# Patient Record
Sex: Male | Born: 1935 | Race: White | Hispanic: No | Marital: Married | Smoking: Never smoker
Health system: Southern US, Community
[De-identification: ages and names within clinical notes are randomized; demographics above are authoritative.]

## PROBLEM LIST (undated history)

## (undated) DIAGNOSIS — N200 Calculus of kidney: Secondary | ICD-10-CM

## (undated) DIAGNOSIS — G20A1 Parkinson's disease without dyskinesia, without mention of fluctuations: Secondary | ICD-10-CM

## (undated) DIAGNOSIS — G20 Parkinson's disease: Secondary | ICD-10-CM

## (undated) DIAGNOSIS — I959 Hypotension, unspecified: Secondary | ICD-10-CM

## (undated) DIAGNOSIS — R413 Other amnesia: Secondary | ICD-10-CM

## (undated) HISTORY — DX: Other amnesia: R41.3

## (undated) HISTORY — DX: Parkinson's disease: G20

## (undated) HISTORY — DX: Parkinson's disease without dyskinesia, without mention of fluctuations: G20.A1

## (undated) HISTORY — PX: OTHER SURGICAL HISTORY: SHX169

## (undated) HISTORY — DX: Calculus of kidney: N20.0

---

## 1991-06-28 ENCOUNTER — Encounter: Payer: Self-pay | Admitting: Cardiology

## 2012-05-23 ENCOUNTER — Other Ambulatory Visit: Payer: Self-pay | Admitting: Neurology

## 2012-05-23 DIAGNOSIS — G20A1 Parkinson's disease without dyskinesia, without mention of fluctuations: Secondary | ICD-10-CM

## 2012-05-23 DIAGNOSIS — G20 Parkinson's disease: Secondary | ICD-10-CM

## 2012-05-23 DIAGNOSIS — W19XXXA Unspecified fall, initial encounter: Secondary | ICD-10-CM

## 2012-05-30 ENCOUNTER — Ambulatory Visit
Admission: RE | Admit: 2012-05-30 | Discharge: 2012-05-30 | Disposition: A | Discharge status: Home / Self Care | Payer: Medicare Other | Source: Ambulatory Visit | Attending: Neurology | Admitting: Neurology

## 2012-05-30 DIAGNOSIS — W19XXXA Unspecified fall, initial encounter: Secondary | ICD-10-CM

## 2012-05-30 DIAGNOSIS — G20 Parkinson's disease: Secondary | ICD-10-CM

## 2013-02-19 ENCOUNTER — Ambulatory Visit: Payer: Self-pay | Admitting: Neurology

## 2013-03-13 ENCOUNTER — Encounter: Payer: Self-pay | Admitting: Neurology

## 2013-03-14 ENCOUNTER — Encounter: Payer: Self-pay | Admitting: Neurology

## 2013-03-14 ENCOUNTER — Ambulatory Visit (INDEPENDENT_AMBULATORY_CARE_PROVIDER_SITE_OTHER): Payer: Medicare Other | Admitting: Neurology

## 2013-03-14 VITALS — BP 146/68 | HR 54 | Resp 16 | Ht 67.0 in | Wt 149.0 lb

## 2013-03-14 DIAGNOSIS — G20A1 Parkinson's disease without dyskinesia, without mention of fluctuations: Secondary | ICD-10-CM | POA: Insufficient documentation

## 2013-03-14 DIAGNOSIS — G20 Parkinson's disease: Secondary | ICD-10-CM | POA: Insufficient documentation

## 2013-03-14 MED ORDER — PRAMIPEXOLE DIHYDROCHLORIDE 0.125 MG PO TABS: 0.1250 mg | ORAL_TABLET | Freq: Three times a day (TID) | ORAL | Status: DC

## 2013-03-14 NOTE — Patient Instructions (Signed)
Parkinson's Disease Parkinson's disease is a disorder of the brain and spinal cord (central nervous system). The person will slowly lose the ability to control his or her body movements. This happens due to:  Damaged nerve cells.  Low levels of a certain brain chemical. HOME CARE  Exercise often.  Make time to rest during the day.  Take all medicine as told by your doctor.  Replace buttons and zippers with elastic and Velcro if getting dressed is difficult.  Put grab bars or rails in your home. This helps you to not fall.  Go to speech therapy or therapy to help you with daily activities (occupational therapy). Do this as told by your doctor.  Keep all doctor visits as told. GET HELP IF:  Your medicine does not help your symptoms.  You fall.  You have trouble swallowing or choke on your food. MAKE SURE YOU:  Understand these instructions.  Will watch your condition.  Will get help right away if you are not doing well or get worse. Document Released: 09/05/2011 Document Reviewed: 09/05/2011 [facility] Patient Information 2014 [place], [place]. Fall Prevention, Elderly Falls are the leading cause of injuries, accidents, and accidental deaths in people over the age of 28. Falling is a real threat to your ability to live on your own. CAUSES   Poor eyesight or poor hearing can make you more likely to fall.   Illnesses and physical conditions can affect your strength and balance.   Poor lighting, throw rugs and pets in your home can make you more likely to trip or slip.   The side effects of some medicines can upset your balance and lead to falling. These include medicines for depression, sleep problems, high blood pressure, diabetes, and heart conditions.  PREVENTION  Be sure your home is as safe as possible. Here are some tips:  Wear shoes with non-skid soles (not house slippers).   Be sure your home and outside area are well lit.   Use night lights throughout your  house, including hallways and stairways.   Remove clutter and clean up spills on floors and walkways.   Remove throw rugs or fasten them to the floor with carpet tape. Tack down carpet edges.   Do not place electrical cords across pathways.   Install grab bars in your bathtub, shower, and toilet area. Towel bars should not be used as a grab bar.   Install handrails on both sides of stairways.   Do not climb on stools or stepladders. Get someone else to help with jobs that require climbing.   Do not wax your floors at all, or use a non-skid wax.   Repair uneven or unsafe sidewalks, walkways or stairs.   Keep frequently used items within reach.   Be aware of pets so you do not trip.  Get regular check-ups from your doctor, and take good care of yourself:  Have your eyes checked every year for vision changes, cataracts, glaucoma, and other eye problems. Wear eyeglasses as directed.   Have your hearing checked every 2 years, or anytime you or others think that you cannot hear well. Use hearing aids as directed.   See your caregiver if you have foot pain or corns. Sore feet can contribute to falls.   Let your caregiver know if a medicine is making you feel dizzy or making you lose your balance.   Use a cane, walker, or wheelchair as directed. Use walker or wheelchair brakes when getting in and out.  When you get up from bed, sit on the side of the bed for 1 to 2 minutes before you stand up. This will give your blood pressure time to adjust, and you will feel less dizzy.   If you need to go to the bathroom often, consider using a bedside commode.  Keep your body in good shape:  Get regular exercise, especially walking.   Do exercises to strengthen the muscles you use for walking and lifting.   Do not smoke.   Minimize use of alcohol.  SEEK IMMEDIATE MEDICAL CARE IF:   You feel dizzy, weak, or unsteady on your feet.   You feel confused.   You fall.  Document Released:  06/13/2005 Document Revised: 06/02/2011 Document Reviewed: 12/08/2006 [facility] Patient Information 2012 [place], [place].

## 2013-03-14 NOTE — Progress Notes (Signed)
[facility]  Provider:  Melvyn Novas, M D  Referring Provider: No ref. provider found Primary Care Physician:  Miguel Aschoff, MD  Chief Complaint  Patient presents with  . Follow-up    tremors,rm 11    HPI:  Austin Juarez is a 79 y.o. male  Is seen here as a referral/ revisit  from Dr.Ross , for Tremor and  muscle rigor, thought to reflect Parkinson's  disease.   The patient has been last seen in this office by NP. The patient continues to have a mild jaw tremor, a right-hand dominant tremor or is not seen today at rest. He does have several dermatitis and a masked face. He continues to play golf and is encouraged to continue physical activity. He is taking care of his granddaughter, who lives with them. During my last visit with the patient on 03-29-2012 the patient did better on Mirapex but due to the expense correlated with his medication stayed on twice a day dosing and did not use it 3 times a day. We waited for January of this year when his prescription but unchanged. Laps and EEGs in the last visit were normal an imaging study was scheduled. He remains independent in all activities of daily living. He had noticed some bruising in the past from daily baby aspirin him and has for that reason stopped the aspirin.  However, I would like for him to try to take the aspirin every other day to have some of the vascular protective benefits of the medication.      Review of Systems: Out of a complete 14 system review, the patient complains of only the following symptoms, and all other reviewed systems are negative. Tremor improved,  Drooling worsened,  Sleeps sound, but has nocturia 2 times nightly, he drools at night , too. Dysphonia. No dysphagia, no falls, he restricted driving to residential only.    History   Social History  . Marital Status: Married    Spouse Name: [name]    Number of Children: 2  . Years of Education: 16   Occupational History  . retired     Social History Main Topics  . Smoking status: Never Smoker   . Smokeless tobacco: Never Used  . Alcohol Use: No  . Drug Use: No  . Sexual Activity: Not on file   Other Topics Concern  . Not on file   Social History Narrative  . No narrative on file    Family History  Problem Relation Age of Onset  . Alcoholism Daughter     Past Medical History  Diagnosis Date  . Kidney stones   . Hypertension   . PD (Parkinson's disease)     History reviewed. No pertinent past surgical history.  Current Outpatient Prescriptions  Medication Sig Dispense Refill  . aspirin 81 MG tablet Take 81 mg by mouth daily.      . Chlorpheniramine Maleate (ALLERGY RELIEF PO) Take 25 mg by mouth daily.      . pramipexole (MIRAPEX) 0.125 MG tablet Take 0.125 mg by mouth 2 (two) times daily.       No current facility-administered medications for this visit.    Allergies as of 03/14/2013 - Review Complete 03/14/2013  Allergen Reaction Noted  . Flomax [tamsulosin hcl] Nausea Only 03/13/2013  . Morphine and related  03/13/2013    Vitals: BP 146/68  Pulse 54  Resp 16  Ht 5\' 7"  (1.702 m)  Wt 149 lb (67.586 kg)  BMI 23.33  kg/m2 Last Weight:  Wt Readings from Last 1 Encounters:  03/14/13 149 lb (67.586 kg)   Last Height:   Ht Readings from Last 1 Encounters:  03/14/13 5\' 7"  (1.702 m)    Physical exam:  General: The patient is awake, alert and appears not in acute distress. The patient is well groomed. He gained 11 pounds since  have seen him last.  Head: Normocephalic, atraumatic. Neck is supple. Cardiovascular:  Regular rate and rhythm, without  murmurs or carotid bruit, and without distended neck veins. Respiratory: Lungs are clear to auscultation. Skin:  Without evidence of edema, or rash Trunk: stooped posture.   Neurologic exam : The patient is awake and alert, oriented to place and time.  Memory subjective described as intact. There is a normal attention span & concentration  ability. Speech is fluent without  Dysarthria, but with  dysphonia  Mood and affect are appropriate.  Cranial nerves: Pupils are equal and briskly reactive to light. Funduscopic exam normal , . Extraocular movements  in vertical and horizontal planes intact and without nystagmus.  Visual fields by finger perimetry are intact. Hearing to finger rub intact.  Facial sensation intact to fine touch. Facial motor strength is symmetric and tongue and uvula move midline.  Motor exam:  Normal muscle bulk and symmetric normal strength in all extremities. Tone is increased, with traces of cog wheeling at wrist and biceps. He has more rigidity in the left.   Sensory:  Fine touch, pinprick and vibration were tested in all extremities. Proprioception is normal. Myerson's sign negative.   Coordination: Rapid alternating movements in the fingers/hands is tested and normal. Finger-to-nose maneuver tested with evidence of  dysmetria and  Tremor on the left .  Gait and station: Patient walks without assistive device  Stance is stable and normal.  Gait demonstrated smaller step width and shuffling, fragmented turning.   NO RESTLESS LEGS -     Assessment - parkinson's disease with left sided dominant manifestations.   Plan : continue exercise , golf, social interaction.  Mirapex to tid po. 0.25 mg po

## 2013-08-06 ENCOUNTER — Telehealth: Payer: Self-pay | Admitting: Neurology

## 2013-08-06 DIAGNOSIS — G20 Parkinson's disease: Secondary | ICD-10-CM

## 2013-08-06 NOTE — Telephone Encounter (Signed)
Patient calling to state that she believes her Mirapex is causing extreme constipation. Please call and advise patient.

## 2013-08-08 MED ORDER — ROPINIROLE HCL 0.25 MG PO TABS: 0.2500 mg | ORAL_TABLET | Freq: Three times a day (TID) | ORAL | Status: DC

## 2013-08-08 NOTE — Telephone Encounter (Signed)
Called patient and explained to him that Dr. Brett Fairy would prescribe another medication called Ropinorol (Requip), that may not cause constipation, per Dr. Brett Fairy. Patient stated that he would like to try it. Please send it to his pharmacy. Thanks

## 2013-08-08 NOTE — Telephone Encounter (Signed)
Called patient about the medication mirapex causing him constipation. Patient stated that he has to take a laxative just to have a bowel movement. Patient would like to know if there is something else that the Dr.Dohmeier could prescribe. Please advise.

## 2013-08-08 NOTE — Telephone Encounter (Signed)
There is Ropinorol, brand name "Requip", that may not cause constipation. I will be happy to prescribe the new medication.

## 2013-09-12 ENCOUNTER — Encounter: Payer: Self-pay | Admitting: Nurse Practitioner

## 2013-09-12 ENCOUNTER — Ambulatory Visit (INDEPENDENT_AMBULATORY_CARE_PROVIDER_SITE_OTHER): Payer: Medicare HMO | Admitting: Nurse Practitioner

## 2013-09-12 ENCOUNTER — Encounter (INDEPENDENT_AMBULATORY_CARE_PROVIDER_SITE_OTHER): Payer: Self-pay

## 2013-09-12 VITALS — BP 133/77 | HR 57 | Ht 67.0 in | Wt 151.0 lb

## 2013-09-12 DIAGNOSIS — G20A1 Parkinson's disease without dyskinesia, without mention of fluctuations: Secondary | ICD-10-CM

## 2013-09-12 DIAGNOSIS — G20 Parkinson's disease: Secondary | ICD-10-CM

## 2013-09-12 MED ORDER — ROPINIROLE HCL 0.25 MG PO TABS: 0.2500 mg | ORAL_TABLET | Freq: Three times a day (TID) | ORAL | Status: DC

## 2013-09-12 NOTE — Patient Instructions (Signed)
Continue Ropinirole  at current dose, will reorder Continue daily exercise Followup in 6 months

## 2013-09-12 NOTE — Progress Notes (Addendum)
[facility]  PATIENT: Austin Juarez DOB: May 01, 1930   REASON FOR VISIT: Followup for Parkinson's disease   HISTORY OF PRESENT ILLNESS: Mr. Austin Juarez, 80 year old male returns for followup he called into the office in February and asked to have his Mirapex changed due to constipation. It was changed to Requip by Dr. Brett Fairy however he does continue to have constipation which is now better with MiraLax. Denies any impulsive behaviors with drug. He continues to play golf several times a week. He remains independent in all ADLs. He has minimal bruising noted from aspirin. Appetite is reportedly good and he is sleeping well. He returns for reevaluation    HISTORY:  Tremor and muscle rigor, thought to reflect Parkinson's disease.  The patient continues to have a mild jaw tremor, a right-hand dominant tremor or is not seen today at rest. He does have several dermatitis and a masked face. He continues to play golf and is encouraged to continue physical activity. He is taking care of his granddaughter, who lives with them. During my last visit with the patient on 03-29-2012 the patient did better on Mirapex but due to the expense correlated with his medication stayed on twice a day dosing and did not use it 3 times a day. We waited for January of this year when his prescription but unchanged. Labs and EEGs in the last visit were normal an imaging study was scheduled. He remains independent in all activities of daily living. He had noticed some bruising in the past from daily baby aspirin him and has for that reason stopped the aspirin.  However, I would like for him to try to take the aspirin every other day to have some of the vascular protective benefits of the medication   REVIEW OF SYSTEMS: Full 14 system review of systems performed and notable only for those listed, all others are neg:  Constitutional: N/A  Cardiovascular: N/A  Ear/Nose/Throat: N/A  Skin: N/A  Eyes: N/A    Respiratory: N/A  Gastroitestinal: N/A  Hematology/Lymphatic: N/A  Endocrine: N/A Musculoskeletal:N/A  Allergy/Immunology: N/A  Neurological: N/A Psychiatric: N/A   ALLERGIES: Allergies  Allergen Reactions  . Flomax [Tamsulosin Hcl] Nausea Only    Violently   . Morphine And Related     nausea    HOME MEDICATIONS: Outpatient Prescriptions Prior to Visit  Medication Sig Dispense Refill  . aspirin 81 MG tablet Take 81 mg by mouth daily.      . Chlorpheniramine Maleate (ALLERGY RELIEF PO) Take 25 mg by mouth daily.      Marland Kitchen rOPINIRole (REQUIP) 0.25 MG tablet Take 1 tablet (0.25 mg total) by mouth 3 (three) times daily.  90 tablet  1  . pramipexole (MIRAPEX) 0.125 MG tablet Take 1 tablet (0.125 mg total) by mouth 3 (three) times daily.  90 tablet  5   No facility-administered medications prior to visit.    PAST MEDICAL HISTORY: Past Medical History  Diagnosis Date  . Kidney stones   . Hypertension   . PD (Parkinson's disease)     PAST SURGICAL HISTORY: History reviewed. No pertinent past surgical history.  FAMILY HISTORY: Family History  Problem Relation Age of Onset  . Alcoholism Daughter     SOCIAL HISTORY: History   Social History  . Marital Status: Married    Spouse Name: [name]    Number of Children: 2  . Years of Education: 16   Occupational History  . retired    Social History Main Topics  .  Smoking status: Never Smoker   . Smokeless tobacco: Never Used  . Alcohol Use: No  . Drug Use: No  . Sexual Activity: Not on file   Other Topics Concern  . Not on file   Social History Narrative   Patient lives at home with wife [name].   Patient has 3 children. 1 deceased.    Patient is retired.    Patient is right handed.    Patient has a college degree.            PHYSICAL EXAM  Filed Vitals:   09/12/13 1404  BP: 133/77  Pulse: 57  Height: 5\' 7"  (1.702 m)  Weight: 151 lb (68.493 kg)   Body mass index is 23.64 kg/(m^2).  Generalized:  Well developed, in no acute distress , well groomed, mild masking of the face Head: normocephalic and atraumatic,. Oropharynx benign  Neck: Supple, no carotid bruits  Cardiac: Regular rate rhythm, no murmur  Musculoskeletal: No deformity   Neurological examination   Mentation: Alert oriented to time, place, history taking. MOCA 27/30. AFT 7. Follows all commands speech and language fluent  Cranial nerve II-XII: Fundoscopic exam reveals sharp disc margins.Pupils were equal round reactive to light extraocular movements were full, visual field were full on confrontational test. Facial sensation and strength were normal. hearing was intact to finger rubbing bilaterally. Uvula tongue midline. head turning and shoulder shrug were normal and symmetric.Tongue protrusion into cheek strength was normal. Motor: normal bulk and tone, full strength in the BUE, BLE,  No focal weakness, mild cogwheeling at the elbows and wrists. Coordination: finger-nose-finger, heel-to-shin bilaterally, no dysmetria, no bradykinesia Reflexes: Brachioradialis 2/2, biceps 2/2, triceps 2/2, patellar 2/2, Achilles 2/2, plantar responses were flexor bilaterally. Gait and Station: Rising up from seated position without assistance, normal stance,  moderate stride, good arm swing, smooth turning, able to perform tiptoe, and heel walking without difficulty. Tandem gait is steady.   DIAGNOSTIC DATA (LABS, IMAGING, TESTING) -    ASSESSMENT AND PLAN  80 y.o. year old male  has a past medical history of Hypertension; and PD (Parkinson's disease). here to followup. His Mirapex was changed to Requip by Dr. Brett Fairy last month due to constipation.  Continue Ropinirole  at current dose, will reorder Continue daily exercise Followup in 6 months Dennie Bible, [facility], [facility], APRN  [facility] [address] [place], [place] [address] [phone]  I reviewed the above note and documentation by the Nurse  Practitioner and agree with the history, physical exam, assessment and plan as outlined above.

## 2014-03-19 ENCOUNTER — Encounter (INDEPENDENT_AMBULATORY_CARE_PROVIDER_SITE_OTHER): Payer: Self-pay

## 2014-03-19 ENCOUNTER — Encounter: Payer: Self-pay | Admitting: Nurse Practitioner

## 2014-03-19 ENCOUNTER — Ambulatory Visit (INDEPENDENT_AMBULATORY_CARE_PROVIDER_SITE_OTHER): Payer: Medicare HMO | Admitting: Nurse Practitioner

## 2014-03-19 VITALS — BP 131/76 | HR 53 | Ht 67.0 in | Wt 144.6 lb

## 2014-03-19 DIAGNOSIS — G20A1 Parkinson's disease without dyskinesia, without mention of fluctuations: Secondary | ICD-10-CM

## 2014-03-19 DIAGNOSIS — G20 Parkinson's disease: Secondary | ICD-10-CM

## 2014-03-19 MED ORDER — ROPINIROLE HCL 0.25 MG PO TABS: 0.2500 mg | ORAL_TABLET | Freq: Three times a day (TID) | ORAL | Status: DC

## 2014-03-19 NOTE — Progress Notes (Signed)
[facility]  PATIENT: SAVANNAH ERBE DOB: 09/07/1929   REASON FOR VISIT: Followup for Parkinson's disease   HISTORY OF PRESENT ILLNESS:Mr. Langdon, 80 year old male returns for followup. He is currently on Requip .25mg  3 times daily without side effects Denies any impulsive behaviors with drug. He continues to play golf several times a week. He remains independent in all ADLs. He has minimal bruising noted from aspirin. Appetite is reportedly good and he is sleeping well. He has not fallen He returns for reevaluation . He has been on Mirapex in the past and complained of constipation.  HISTORY: Tremor and muscle rigor, thought to reflect Parkinson's disease.  The patient continues to have a mild jaw tremor, a right-hand dominant tremor or is not seen today at rest. He does have several dermatitis and a masked face. He continues to play golf and is encouraged to continue physical activity. He is taking care of his granddaughter, who lives with them. During my last visit with the patient on 03-29-2012 the patient did better on Mirapex but due to the expense correlated with his medication stayed on twice a day dosing and did not use it 3 times a day. We waited for January of this year when his prescription but unchanged. Labs and EEGs in the last visit were normal an imaging study was scheduled. He remains independent in all activities of daily living. He had noticed some bruising in the past from daily baby aspirin him and has for that reason stopped the aspirin.  However, I would like for him to try to take the aspirin every other day to have some of the vascular protective benefits of the medication      REVIEW OF SYSTEMS: Full 14 system review of systems performed and notable only for those listed, all others are neg:  Constitutional: N/A  Cardiovascular: N/A  Ear/Nose/Throat: N/A  Skin: N/A  Eyes: N/A  Respiratory: N/A  Gastroitestinal: N/A  Hematology/Lymphatic: N/A   Endocrine: N/A Musculoskeletal:N/A  Allergy/Immunology: Environmental allergies  Neurological: N/A Psychiatric: N/A Sleep : NA   ALLERGIES: Allergies  Allergen Reactions  . Flomax [Tamsulosin Hcl] Nausea Only    Violently   . Morphine And Related     nausea    HOME MEDICATIONS: Outpatient Prescriptions Prior to Visit  Medication Sig Dispense Refill  . aspirin 81 MG tablet Take 81 mg by mouth daily.      . Chlorpheniramine Maleate (ALLERGY RELIEF PO) Take 25 mg by mouth daily.      . polyethylene glycol powder (GLYCOLAX/MIRALAX) powder       . rOPINIRole (REQUIP) 0.25 MG tablet Take 1 tablet (0.25 mg total) by mouth 3 (three) times daily.  90 tablet  5   No facility-administered medications prior to visit.    PAST MEDICAL HISTORY: Past Medical History  Diagnosis Date  . Kidney stones   . Hypertension   . PD (Parkinson's disease)     PAST SURGICAL HISTORY: Past Surgical History  Procedure Laterality Date  . Arm surgery Left     CANCER    FAMILY HISTORY: Family History  Problem Relation Age of Onset  . Alcoholism Daughter     SOCIAL HISTORY: History   Social History  . Marital Status: Married    Spouse Name: [name]    Number of Children: 2  . Years of Education: 16   Occupational History  . retired    Social History Main Topics  . Smoking status: Never Smoker   .  Smokeless tobacco: Never Used  . Alcohol Use: No  . Drug Use: No  . Sexual Activity: Not on file   Other Topics Concern  . Not on file   Social History Narrative   Patient lives at home with wife [name].   Patient has 3 children. 1 deceased.    Patient is retired.    Patient is right handed.    Patient has a college degree.            PHYSICAL EXAM  Filed Vitals:   03/19/14 1445  BP: 131/76  Pulse: 53  Height: 5\' 7"  (1.702 m)  Weight: 144 lb 9.6 oz (65.59 kg)   Body mass index is 22.64 kg/(m^2). Generalized: Well developed, in no acute distress , well groomed, mild  masking of the face  Head: normocephalic and atraumatic,. Oropharynx benign  Neck: Supple, no carotid bruits  Cardiac: Regular rate rhythm, no murmur  Musculoskeletal: No deformity  Neurological examination  Mentation: Alert oriented to time, place, history taking. [place] 23/30. AFT 7. Follows all commands speech and language fluent  Cranial nerve II-XII: Pupils were equal round reactive to light extraocular movements were full, visual field were full on confrontational test. Facial sensation and strength were normal. hearing was intact to finger rubbing bilaterally. Uvula tongue midline. head turning and shoulder shrug were normal and symmetric.Tongue protrusion into cheek strength was normal.  Motor: normal bulk and tone, full strength in the BUE, BLE, No focal weakness, mild cogwheeling at the elbows and wrists.  Coordination: finger-nose-finger, heel-to-shin bilaterally, no dysmetria, no bradykinesia  Reflexes: Brachioradialis 2/2, biceps 2/2, triceps 2/2, patellar 2/2, Achilles 2/2, plantar responses were flexor bilaterally.  Gait and Station: Rising up from seated position without assistance, normal stance, moderate stride, good arm swing, smooth turning, able to perform tiptoe, and heel walking without difficulty. Tandem gait is steady.     DIAGNOSTIC DATA (LABS, IMAGING, TESTING) - ASSESSMENT AND PLAN  80 y.o. year old male  has a past medical history of  PD (Parkinson's disease). here to follow up.   Continue  Requip at  current dose will refill Continue daily exercise Followup in 6 months Next visit with Dr. De Nurse, [facility], [facility], [facility] [address] [place], [place] [address] [phone]

## 2014-03-19 NOTE — Patient Instructions (Signed)
Continue  Requip at  current dose will refill Continue daily exercise Followup in 6 months Next visit with Dr. Brett Fairy

## 2014-03-20 NOTE — Progress Notes (Signed)
I agree with the assessment and plan as directed by NP .The patient is known to me .   Zenobia Kuennen, MD  

## 2014-05-14 ENCOUNTER — Encounter: Payer: Self-pay | Admitting: Neurology

## 2014-05-20 ENCOUNTER — Encounter: Payer: Self-pay | Admitting: Neurology

## 2014-09-17 ENCOUNTER — Ambulatory Visit (INDEPENDENT_AMBULATORY_CARE_PROVIDER_SITE_OTHER): Payer: Medicare HMO | Admitting: Neurology

## 2014-09-17 ENCOUNTER — Encounter: Payer: Self-pay | Admitting: Neurology

## 2014-09-17 VITALS — BP 133/70 | HR 55 | Resp 14 | Ht 66.5 in | Wt 141.4 lb

## 2014-09-17 DIAGNOSIS — G20 Parkinson's disease: Secondary | ICD-10-CM | POA: Diagnosis not present

## 2014-09-17 NOTE — Progress Notes (Signed)
[facility]  Provider:  Larey Seat, M D  Referring Provider: Harle Battiest, MD Primary Care Physician:  Harle Battiest, MD  Chief Complaint  Patient presents with  . RV PD    Rm 11,     HPI:  Austin Juarez is a 81 y.o. male  Is seen here as a referral/ revisit  from [place] , for Tremor and  muscle rigor, thought to reflect Parkinson's  disease.   The patient has been last seen in this office by NP. The patient continues to have a mild jaw tremor, a right-hand dominant tremor or is not seen today at rest. He does have several dermatitis and a masked face. He continues to play golf and is encouraged to continue physical activity. He is taking care of his granddaughter, who lives with them. During my last visit with the patient on 03-29-2012 the patient did better on Mirapex but due to the expense correlated with his medication stayed on twice a day dosing and did not use it 3 times a day. We waited for January of this year when his prescription but unchanged. Laps and EEGs in the last visit were normal an imaging study was scheduled. He remains independent in all activities of daily living. He had noticed some bruising in the past from daily baby aspirin him and has for that reason stopped the aspirin.  However, I would like for him to try to take the aspirin every other day to have some of the vascular protective benefits of the medication.  09-17-14  Visible resting tremor, right hand dominant with a slight jaw tremor. The patient has some dysphonia but reports no dysphagia. Today's Montral cognitive assessment test scored at 24 out of 30 points in the last visit he scored 26 out of 30 points. I think he lost his points because his fine motor skills were worse. He is fully oriented but he lost 3 of the 5 recall words. One point and obstruction, and one point in word fluency. He did the serial 7 the attention to words and the digit listing. He is feeling he gets worse, He  lives no alone with his wife, his granddaughter who used to reside with him has moved to [place]. His grandchildren are orphans and have alcohol embryopathy.       Review of Systems: Out of a complete 14 system review, the patient complains of only the following symptoms, and all other reviewed systems are negative. Tremor improved,  Drooling worsened,  Sleeps sound, but has nocturia 2 times nightly, he drools at night , too. Dysphonia. No dysphagia, no falls, he restricted driving to residential only.    History   Social History  . Marital Status: Married    Spouse Name: [name]  . Number of Children: 2  . Years of Education: 16   Occupational History  . retired    Social History Main Topics  . Smoking status: Never Smoker   . Smokeless tobacco: Never Used  . Alcohol Use: No  . Drug Use: No  . Sexual Activity: Not on file   Other Topics Concern  . Not on file   Social History Narrative   Patient lives at home with wife [name].   Patient has 3 children. 1 deceased.    Patient is retired.    Patient is right handed.    Patient has a college degree.    Caffeine 2 cups coffee daily.  Family History  Problem Relation Age of Onset  . Alcoholism Daughter     Past Medical History  Diagnosis Date  . Kidney stones   . Hypertension   . PD (Parkinson's disease)     Past Surgical History  Procedure Laterality Date  . Arm surgery Left     CANCER    Current Outpatient Prescriptions  Medication Sig Dispense Refill  . aspirin 81 MG tablet Take 81 mg by mouth daily.    . Chlorpheniramine Maleate (ALLERGY RELIEF PO) Take 25 mg by mouth daily.    . polyethylene glycol powder (GLYCOLAX/MIRALAX) powder     . rOPINIRole (REQUIP) 0.25 MG tablet Take 1 tablet (0.25 mg total) by mouth 3 (three) times daily. 90 tablet 5   No current facility-administered medications for this visit.    Allergies as of 09/17/2014 - Review Complete 09/17/2014  Allergen Reaction  Noted  . Flomax [tamsulosin hcl] Nausea Only 03/13/2013  . Morphine and related  03/13/2013    Vitals: BP 133/70 mmHg  Pulse 55  Resp 14  Ht 5' 6.5" (1.689 m)  Wt 141 lb 6.4 oz (64.139 kg)  BMI 22.48 kg/m2 Last Weight:  Wt Readings from Last 1 Encounters:  09/17/14 141 lb 6.4 oz (64.139 kg)   Last Height:   Ht Readings from Last 1 Encounters:  09/17/14 5' 6.5" (1.689 m)    Physical exam:  General: The patient is awake, alert and appears not in acute distress. The patient is well groomed. He again lost pounds since have seen him last.  He is trying to drink a lot of milk shakes to combat that. Head: Normocephalic, atraumatic. Neck is supple.  Cardiovascular:  Regular rate and rhythm, without  murmurs or carotid bruit, and without distended neck veins. Respiratory: Lungs are clear to auscultation. Skin:  Without evidence of edema, or rash Trunk: stooped posture.   Neurologic exam : The patient is awake and alert, oriented to place and time.  Memory subjective described as intact.  Smoke Montral cognitive assessment showed 3. loss in the 5 word recall category, he also had some fluency problems with word finding. He corrected himself on the trail making test due to his tremor he does have a slight change in clock face and drawing of a cube. I would give him 4 points. He would have scored 24 out of 30 today and scored last time when I saw him 26 out of 30 points. His handwriting has significantly deteriorated his fine motor skills. He has a tremor that is visible when ever he tries to draw a line. There is a normal attention span & concentration ability.  Speech is fluent without with  dysphonia   Mood and affect are frustrated.  Cranial nerves: Pupils are equal and briskly reactive to light. Funduscopic exam normal.  Extraocular movements  in vertical and horizontal planes intact and without nystagmus.  Visual fields by finger perimetry are intact. Hearing to finger rub intact.  Facial sensation intact to fine touch. Facial motor strength is symmetric and tongue and uvula move midline. Motor exam:  Normal muscle bulk and symmetric  strength in all extremities. Frequent cramping in the left leg, left foot's second toe has hammer shape.   Tone is increased, with traces of cog wheeling at wrist and biceps. He has more rigidity in the left.  Sensory:  Fine touch, pinprick and vibration were tested in all extremities. Proprioception is normal. No pain in toes.  Myerson's sign negative.  Coordination: Rapid alternating movements in the fingers/hands is tested and normal.  Finger-to-nose maneuver tested with evidence of  dysmetria and  tremo. Gait and station: Patient walks without assistive device  Stance is stable and normal.   The patient did not report any falls he feels that he is stable in his gait as long as on even  floor.  Gait demonstrated smaller step width and shuffling, fragmented turning.  Gait in office over 50 years ,  No SOB.   NO RESTLESS LEGS -     Assessment - parkinson's disease with left sided dominant manifestations.   Resting tremor asymmetric, he needs no refills.  Plan : continue exercise , golf, social interaction.  Mirapex to tid po. 0.25 mg po

## 2015-01-27 ENCOUNTER — Other Ambulatory Visit: Payer: Self-pay | Admitting: Nurse Practitioner

## 2015-03-16 ENCOUNTER — Ambulatory Visit (INDEPENDENT_AMBULATORY_CARE_PROVIDER_SITE_OTHER): Payer: Medicare HMO | Admitting: Adult Health

## 2015-03-16 ENCOUNTER — Encounter: Payer: Self-pay | Admitting: Adult Health

## 2015-03-16 VITALS — BP 158/64 | HR 61 | Ht 67.0 in | Wt 140.5 lb

## 2015-03-16 DIAGNOSIS — G20 Parkinson's disease: Secondary | ICD-10-CM | POA: Diagnosis not present

## 2015-03-16 NOTE — Progress Notes (Signed)
PATIENT: Austin Juarez DOB: 1929-12-03  REASON FOR VISIT: follow up-Parkinson's disease HISTORY FROM: patient  HISTORY OF PRESENT ILLNESS: Mr. Austin Juarez is a 81 year old male with a history of Parkinson's disease. He returns today for follow-up. The patient is currently taking Requip 3 times a day. He feels that this medication has been beneficial. He denies any significant changes since the last visit. Denies any changes with his gait or balance. He does state that he has learned not to make turns very quickly. He does not use a walker or cane when ambulating. Denies any falls. He states that his tremor is primarily located in the left hand. He denies any changes with his swallowing. He states that if he takes a big bite of food this sometimes causes him some issues. Denies any changes with his sleep. He does state that he gets up typically 3 times a night to urinate. Overall he feels that things have maintained. He returns today for an evaluation.  HISTORY  09/17/14 (Dohmeier): Austin Juarez is a 81 y.o. male Is seen here as a referral/ revisit from [place] , for Tremor and muscle rigor, thought to reflect Parkinson's disease.   The patient has been last seen in this office by NP. The patient continues to have a mild jaw tremor, a right-hand dominant tremor or is not seen today at rest. He does have several dermatitis and a masked face. He continues to play golf and is encouraged to continue physical activity. He is taking care of his granddaughter, who lives with them. During my last visit with the patient on 03-29-2012 the patient did better on Mirapex but due to the expense correlated with his medication stayed on twice a day dosing and did not use it 3 times a day. We waited for January of this year when his prescription but unchanged. Laps and EEGs in the last visit were normal an imaging study was scheduled. He remains independent in all activities of daily living. He had noticed some  bruising in the past from daily baby aspirin him and has for that reason stopped the aspirin. However, I would like for him to try to take the aspirin every other day to have some of the vascular protective benefits of the medication.  09-17-14  Visible resting tremor, right hand dominant with a slight jaw tremor. The patient has some dysphonia but reports no dysphagia. Today's Montral cognitive assessment test scored at 24 out of 30 points in the last visit he scored 26 out of 30 points. I think he lost his points because his fine motor skills were worse. He is fully oriented but he lost 3 of the 5 recall words. One point and obstruction, and one point in word fluency. He did the serial 7 the attention to words and the digit listing. He is feeling he gets worse, He lives no alone with his wife, his granddaughter who used to reside with him has moved to [place]. His grandchildren are orphans and have alcohol embryopathy.     REVIEW OF SYSTEMS: Out of a complete 14 system review of symptoms, the patient complains only of the following symptoms, and all other reviewed systems are negative.  Constipation, drooling, memory loss, dizziness, tremors  ALLERGIES: Allergies  Allergen Reactions  . Flomax [Tamsulosin Hcl] Nausea Only    Violently   . Morphine And Related     nausea    HOME MEDICATIONS: Outpatient Prescriptions Prior to Visit  Medication Sig Dispense Refill  .  aspirin 81 MG tablet Take 81 mg by mouth daily.    . Chlorpheniramine Maleate (ALLERGY RELIEF PO) Take 25 mg by mouth daily.    . polyethylene glycol powder (GLYCOLAX/MIRALAX) powder     . rOPINIRole (REQUIP) 0.25 MG tablet TAKE 1 TABLET (0.25 MG TOTAL) BY MOUTH 3 (THREE) TIMES DAILY. 90 tablet 6   No facility-administered medications prior to visit.    PAST MEDICAL HISTORY: Past Medical History  Diagnosis Date  . Kidney stones   . Hypertension   . PD (Parkinson's disease)     PAST SURGICAL HISTORY: Past  Surgical History  Procedure Laterality Date  . Arm surgery Left     CANCER    FAMILY HISTORY: Family History  Problem Relation Age of Onset  . Alcoholism Daughter     SOCIAL HISTORY: Social History   Social History  . Marital Status: Married    Spouse Name: [name]  . Number of Children: 2  . Years of Education: 16   Occupational History  . retired    Social History Main Topics  . Smoking status: Never Smoker   . Smokeless tobacco: Never Used  . Alcohol Use: No  . Drug Use: No  . Sexual Activity: Not on file   Other Topics Concern  . Not on file   Social History Narrative   Patient lives at home with wife [name].   Patient has 3 children. 1 deceased.    Patient is retired.    Patient is right handed.    Patient has a college degree.    Caffeine 2 cups coffee daily.             PHYSICAL EXAM  Filed Vitals:   03/16/15 1500  BP: 158/64  Pulse: 61  Height: 5\' 7"  (1.702 m)  Weight: 140 lb 8 oz (63.73 kg)   Body mass index is 22 kg/(m^2).   Montreal Cognitive Assessment  03/16/2015 09/17/2014 03/19/2014  Visuospatial/ Executive (0/5) 3 4 2   Naming (0/3) 3 3 3   Attention: Read list of digits (0/2) 2 2 2   Attention: Read list of letters (0/1) 1 1 1   Attention: Serial 7 subtraction starting at 100 (0/3) 3 3 3   Language: Repeat phrase (0/2) 1 2 2   Language : Fluency (0/1) 1 0 0  Abstraction (0/2) 2 1 1   Delayed Recall (0/5) 1 2 3   Orientation (0/6) 6 6 6   Total 23 24 23   Adjusted Score (based on education) 23 - 23     Generalized: Well developed, in no acute distress   Neurological examination  Mentation: Alert oriented to time, place, history taking. Follows all commands speech and language fluent Cranial nerve II-XII: Pupils were equal round reactive to light. Extraocular movements were full, visual field were full on confrontational test. Facial sensation and strength were normal. Uvula tongue midline. Head turning and shoulder shrug  were normal and  symmetric. Motor: The motor testing reveals 5 over 5 strength of all 4 extremities. Good symmetric motor tone is noted throughout. Mild resting tremor noted in the left hand  Sensory: Sensory testing is intact to soft touch on all 4 extremities. No evidence of extinction is noted.  Coordination: Cerebellar testing reveals good finger-nose-finger and heel-to-shin bilaterally.  Gait and station: Patient is able to stand without assistance. The patient has good stride but decreased arm swing. Tandem gait is good. Romberg is negative Reflexes: Deep tendon reflexes are symmetric and normal bilaterally.   DIAGNOSTIC DATA (LABS, IMAGING, TESTING) -  I reviewed patient records, labs, notes, testing and imaging myself where available.   ASSESSMENT AND PLAN 81 y.o. year old male  has a past medical history of Kidney stones; Hypertension; and PD (Parkinson's disease). here with:  1. Parkinson's disease  Overall the patient is doing well. He will continue on Requip. He denies needing any refills at this time. I have instructed the patient that if his symptoms worsen or he develops any new symptoms he should let us know. He will follow-up in 6 months or sooner if needed.  Ward Givens, MSN, NP-C 03/16/2015, 3:23 PM [facility] [address], [place], [place] [address] [phone]

## 2015-03-16 NOTE — Progress Notes (Signed)
I have reviewed and agreed above plan. 

## 2015-03-16 NOTE — Patient Instructions (Signed)
Continue Requip. If your symptoms worsen or you develop new symptoms please let us know.

## 2015-08-20 ENCOUNTER — Encounter (HOSPITAL_COMMUNITY): Payer: Self-pay | Admitting: Nurse Practitioner

## 2015-08-20 ENCOUNTER — Emergency Department (HOSPITAL_COMMUNITY)
Admission: EM | Admit: 2015-08-20 | Discharge: 2015-08-20 | Disposition: A | Discharge status: Home / Self Care | Payer: Medicare HMO | Attending: Emergency Medicine | Admitting: Emergency Medicine

## 2015-08-20 DIAGNOSIS — G20 Parkinson's disease: Secondary | ICD-10-CM | POA: Insufficient documentation

## 2015-08-20 DIAGNOSIS — R55 Syncope and collapse: Secondary | ICD-10-CM | POA: Diagnosis not present

## 2015-08-20 DIAGNOSIS — Y939 Activity, unspecified: Secondary | ICD-10-CM | POA: Diagnosis not present

## 2015-08-20 DIAGNOSIS — Z043 Encounter for examination and observation following other accident: Secondary | ICD-10-CM | POA: Insufficient documentation

## 2015-08-20 DIAGNOSIS — Y929 Unspecified place or not applicable: Secondary | ICD-10-CM | POA: Insufficient documentation

## 2015-08-20 DIAGNOSIS — W19XXXA Unspecified fall, initial encounter: Secondary | ICD-10-CM | POA: Insufficient documentation

## 2015-08-20 DIAGNOSIS — Y999 Unspecified external cause status: Secondary | ICD-10-CM | POA: Insufficient documentation

## 2015-08-20 DIAGNOSIS — I10 Essential (primary) hypertension: Secondary | ICD-10-CM | POA: Insufficient documentation

## 2015-08-20 LAB — CBC
HCT: 37.8 % — ABNORMAL LOW (ref 39.0–52.0)
HEMOGLOBIN: 13.1 g/dL (ref 13.0–17.0)
MCH: 34.4 pg — ABNORMAL HIGH (ref 26.0–34.0)
MCHC: 34.7 g/dL (ref 30.0–36.0)
MCV: 99.2 fL (ref 78.0–100.0)
Platelets: 127 10*3/uL — ABNORMAL LOW (ref 150–400)
RBC: 3.81 MIL/uL — ABNORMAL LOW (ref 4.22–5.81)
RDW: 12.7 % (ref 11.5–15.5)
WBC: 5.7 10*3/uL (ref 4.0–10.5)

## 2015-08-20 LAB — BASIC METABOLIC PANEL
ANION GAP: 11 (ref 5–15)
BUN: 32 mg/dL — ABNORMAL HIGH (ref 6–20)
CALCIUM: 9.4 mg/dL (ref 8.9–10.3)
CO2: 25 mmol/L (ref 22–32)
Chloride: 107 mmol/L (ref 101–111)
Creatinine, Ser: 1.5 mg/dL — ABNORMAL HIGH (ref 0.61–1.24)
GFR, EST AFRICAN AMERICAN: 48 mL/min — AB (ref >60)
GFR, EST NON AFRICAN AMERICAN: 41 mL/min — AB (ref >60)
GLUCOSE: 115 mg/dL — AB (ref 65–99)
Potassium: 4.3 mmol/L (ref 3.5–5.1)
SODIUM: 143 mmol/L (ref 135–145)

## 2015-08-20 NOTE — ED Notes (Signed)
Pt. sts was reaching up to fix the door and fell to his left side. Son sts pt lost consciousness for appx 1.5 min. Pt denies pain- no obvious deformities noted. Alert and oriented x4

## 2015-08-20 NOTE — ED Notes (Signed)
Austin Juarez son sts patient no longer willing to wait. Patient in NAD.

## 2015-09-15 ENCOUNTER — Ambulatory Visit: Payer: Medicare HMO | Admitting: Adult Health

## 2015-12-27 ENCOUNTER — Other Ambulatory Visit: Payer: Self-pay | Admitting: Neurology

## 2016-07-26 ENCOUNTER — Other Ambulatory Visit: Payer: Self-pay | Admitting: Neurology

## 2016-09-04 ENCOUNTER — Other Ambulatory Visit: Payer: Self-pay | Admitting: Neurology

## 2016-09-07 ENCOUNTER — Encounter: Payer: Self-pay | Admitting: Neurology

## 2016-09-07 ENCOUNTER — Other Ambulatory Visit: Payer: Self-pay | Admitting: Neurology

## 2016-09-07 MED ORDER — ROPINIROLE HCL 0.25 MG PO TABS: ORAL_TABLET | ORAL | 0 refills | Status: DC

## 2016-09-07 NOTE — Telephone Encounter (Signed)
Patient called office in reference to refilling rOPINIRole (REQUIP) 0.25 MG tablet patient has made a fu visit for 10/27/16.

## 2016-09-07 NOTE — Addendum Note (Signed)
Addended by: Larey Seat on: 09/07/2016 12:12 PM   Modules accepted: Orders

## 2016-09-07 NOTE — Telephone Encounter (Signed)
Last seen 2016... Now has appt on 10/27/16 ok to fill until appt?

## 2016-09-07 NOTE — Telephone Encounter (Signed)
OK to refill/CD

## 2016-09-20 DIAGNOSIS — D1801 Hemangioma of skin and subcutaneous tissue: Secondary | ICD-10-CM | POA: Diagnosis not present

## 2016-09-20 DIAGNOSIS — L308 Other specified dermatitis: Secondary | ICD-10-CM | POA: Diagnosis not present

## 2016-09-20 DIAGNOSIS — Z85828 Personal history of other malignant neoplasm of skin: Secondary | ICD-10-CM | POA: Diagnosis not present

## 2016-09-20 DIAGNOSIS — L821 Other seborrheic keratosis: Secondary | ICD-10-CM | POA: Diagnosis not present

## 2016-10-04 ENCOUNTER — Other Ambulatory Visit: Payer: Self-pay | Admitting: Neurology

## 2016-10-18 DIAGNOSIS — D1801 Hemangioma of skin and subcutaneous tissue: Secondary | ICD-10-CM | POA: Diagnosis not present

## 2016-10-18 DIAGNOSIS — L570 Actinic keratosis: Secondary | ICD-10-CM | POA: Diagnosis not present

## 2016-10-18 DIAGNOSIS — L812 Freckles: Secondary | ICD-10-CM | POA: Diagnosis not present

## 2016-10-18 DIAGNOSIS — Z85828 Personal history of other malignant neoplasm of skin: Secondary | ICD-10-CM | POA: Diagnosis not present

## 2016-10-18 DIAGNOSIS — Z85820 Personal history of malignant melanoma of skin: Secondary | ICD-10-CM | POA: Diagnosis not present

## 2016-10-18 DIAGNOSIS — L308 Other specified dermatitis: Secondary | ICD-10-CM | POA: Diagnosis not present

## 2016-10-27 ENCOUNTER — Ambulatory Visit: Payer: Medicare HMO | Admitting: Adult Health

## 2016-10-31 ENCOUNTER — Other Ambulatory Visit: Payer: Self-pay | Admitting: Neurology

## 2016-11-29 ENCOUNTER — Encounter: Payer: Self-pay | Admitting: Adult Health

## 2016-11-29 ENCOUNTER — Ambulatory Visit (INDEPENDENT_AMBULATORY_CARE_PROVIDER_SITE_OTHER): Payer: PPO | Admitting: Adult Health

## 2016-11-29 VITALS — BP 133/63 | HR 61 | Ht 67.0 in | Wt 131.2 lb

## 2016-11-29 DIAGNOSIS — G20 Parkinson's disease: Secondary | ICD-10-CM | POA: Diagnosis not present

## 2016-11-29 DIAGNOSIS — R296 Repeated falls: Secondary | ICD-10-CM

## 2016-11-29 DIAGNOSIS — G20A1 Parkinson's disease without dyskinesia, without mention of fluctuations: Secondary | ICD-10-CM

## 2016-11-29 MED ORDER — ROPINIROLE HCL 0.25 MG PO TABS: ORAL_TABLET | ORAL | 5 refills | Status: DC

## 2016-11-29 NOTE — Progress Notes (Addendum)
PATIENT: Austin Juarez DOB: 1929/09/17  REASON FOR VISIT: follow up- parkinson's disease HISTORY FROM: patient  HISTORY OF PRESENT ILLNESS: Austin Juarez is an 83 year old male with a history of Parkinson's disease. He returns today for follow-up. He is currently taking Requip 2 times a day. This has been prescribed 3 times a day however he reports that he is only been doing it twice a day. He reports that he is having more difficulty with ambulation and falls. He states that he has approximately 6 falls since the last visit. Fortunately has not suffered any injuries. He feels that the tremor has remained stable. Reports he has a tremor in both hands does not feel that one side is worse than other. He reports some difficulty swallowing but denies choking on food or liquids. He reports that he is sleeping okay. His wife has dementia so he tries to assist her. He is not using an assistive device when ambulating. He returns today for an evaluation.  HISTORY 03/16/15:  Austin Juarez is a 83 year old male with a history of Parkinson's disease. He returns today for follow-up. The patient is currently taking Requip 3 times a day. He feels that this medication has been beneficial. He denies any significant changes since the last visit. Denies any changes with his gait or balance. He does state that he has learned not to make turns very quickly. He does not use a walker or cane when ambulating. Denies any falls. He states that his tremor is primarily located in the left hand. He denies any changes with his swallowing. He states that if he takes a big bite of food this sometimes causes him some issues. Denies any changes with his sleep. He does state that he gets up typically 3 times a night to urinate. Overall he feels that things have maintained. He returns today for an evaluation.  HISTORY  09/17/14 (Dohmeier): Austin Juarez is a 83 y.o. male Is seen here as a referral/ revisit from [place] , for Tremor  and muscle rigor, thought to reflect Parkinson's disease.   The patient has been last seen in this office by NP. The patient continues to have a mild jaw tremor, a right-hand dominant tremor or is not seen today at rest. He does have several dermatitis and a masked face. He continues to play golf and is encouraged to continue physical activity. He is taking care of his granddaughter, who lives with them. During my last visit with the patient on 03-29-2012 the patient did better on Mirapex but due to the expense correlated with his medication stayed on twice a day dosing and did not use it 3 times a day. We waited for January of this year when his prescription but unchanged. Laps and EEGs in the last visit were normal an imaging study was scheduled. He remains independent in all activities of daily living. He had noticed some bruising in the past from daily baby aspirin him and has for that reason stopped the aspirin. However, I would like for him to try to take the aspirin every other day to have some of the vascular protective benefits of the medication.  09-17-14  Visible resting tremor, right hand dominant with a slight jaw tremor. The patient has some dysphonia but reports no dysphagia. Today's Montral cognitive assessment test scored at 24 out of 30 points in the last visit he scored 26 out of 30 points. I think he lost his points because his fine motor skills were  worse. He is fully oriented but he lost 3 of the 5 recall words. One point and obstruction, and one point in word fluency. He did the serial 7 the attention to words and the digit listing. He is feeling he gets worse, He lives no alone with his wife, his granddaughter who used to reside with him has moved to [place]. His grandchildren are orphans and have alcohol embryopathy.      REVIEW OF SYSTEMS: Out of a complete 14 system review of symptoms, the patient complains only of the following symptoms, and all other reviewed  systems are negative.  Memory loss, dizziness, tremors, walking difficulty  ALLERGIES: Allergies  Allergen Reactions  . Flomax [Tamsulosin Hcl] Nausea Only    Violently   . Morphine And Related     nausea    HOME MEDICATIONS: Outpatient Medications Prior to Visit  Medication Sig Dispense Refill  . polyethylene glycol powder (GLYCOLAX/MIRALAX) powder Take by mouth as needed.     Marland Kitchen rOPINIRole (REQUIP) 0.25 MG tablet TAKE 1 TABLET (0.25 MG TOTAL) BY MOUTH 3 (THREE) TIMES DAILY. 90 tablet 0  . aspirin 81 MG tablet Take 81 mg by mouth daily.    . Chlorpheniramine Maleate (ALLERGY RELIEF PO) Take 25 mg by mouth daily.     No facility-administered medications prior to visit.     PAST MEDICAL HISTORY: Past Medical History:  Diagnosis Date  . Hypertension   . Kidney stones   . PD (Parkinson's disease) ([place])     PAST SURGICAL HISTORY: Past Surgical History:  Procedure Laterality Date  . ARM SURGERY Left    CANCER    FAMILY HISTORY: Family History  Problem Relation Age of Onset  . Alcoholism Daughter     SOCIAL HISTORY: Social History   Social History  . Marital status: Married    Spouse name: [name]  . Number of children: 2  . Years of education: 16   Occupational History  . retired    Social History Main Topics  . Smoking status: Never Smoker  . Smokeless tobacco: Never Used  . Alcohol use No  . Drug use: No  . Sexual activity: Not on file   Other Topics Concern  . Not on file   Social History Narrative   Patient lives at home with wife [name].   Patient has 3 children. 1 deceased.    Patient is retired.    Patient is right handed.    Patient has a college degree.    Caffeine 2 cups coffee daily.             PHYSICAL EXAM  Vitals:   11/29/16 1345  BP: 133/63  Pulse: 61  Weight: 131 lb 3.2 oz (59.5 kg)  Height: 5\' 7"  (1.702 m)   Body mass index is 20.55 kg/m.  Generalized: Well developed, in no acute distress   Neurological  examination  Mentation: Alert oriented to time, place, history taking. Follows all commands speech and language fluent. Masked face Cranial nerve II-XII: Pupils were equal round reactive to light. Extraocular movements were full, visual field were full on confrontational test. Facial sensation and strength were normal. Uvula tongue midline. Head turning and shoulder shrug  were normal and symmetric. Motor: The motor testing reveals 5 over 5 strength of all 4 extremities. Good symmetric motor tone is noted throughout.  Sensory: Sensory testing is intact to soft touch on all 4 extremities. No evidence of extinction is noted.  Coordination: Cerebellar testing reveals good finger-nose-finger  and heel-to-shin bilaterally.  Gait and station: Patient is unable to stand without assistance. The patient has decreased armswing bilaterally. Decreased stride. Difficulty with turns usually taking 6 or more steps. Romberg is negative. Reflexes: Deep tendon reflexes are symmetric and normal bilaterally.   DIAGNOSTIC DATA (LABS, IMAGING, TESTING) - I reviewed patient records, labs, notes, testing and imaging myself where available.  Lab Results  Component Value Date   WBC 5.7 08/20/2015   HGB 13.1 08/20/2015   HCT 37.8 (L) 08/20/2015   MCV 99.2 08/20/2015   PLT 127 (L) 08/20/2015      Component Value Date/Time   NA 143 08/20/2015 1421   K 4.3 08/20/2015 1421   CL 107 08/20/2015 1421   CO2 25 08/20/2015 1421   GLUCOSE 115 (H) 08/20/2015 1421   BUN 32 (H) 08/20/2015 1421   CREATININE 1.50 (H) 08/20/2015 1421   CALCIUM 9.4 08/20/2015 1421   GFRNONAA 41 (L) 08/20/2015 1421   GFRAA 48 (L) 08/20/2015 1421      ASSESSMENT AND PLAN 83 y.o. year old male  has a past medical history of Hypertension; Kidney stones; and PD (Parkinson's disease) ([place]). here with :  1. Parkinson's disease 2. Frequent falls  I have encouraged the patient to try taking Requip 3 times a day to see if it offers him any  additional benefit. I will also send the patient to physical therapy for gait and balance training. In the future we may have to consider adding on Sinemet. Patient and his son are advised that if his symptoms worsen or he develops new symptoms that he should let us know. Patient will follow-up in 3 months with Dr. Mechele Claude, MSN, NP-C 11/29/2016, 2:04 PM [facility] [address] [place], [place] [address] [phone]

## 2016-11-29 NOTE — Patient Instructions (Addendum)
Try taking Requip three times a day Referral for physical therapy  mini-mental status exam

## 2016-11-30 NOTE — Progress Notes (Signed)
I agree with the assessment and plan as directed by NP .The patient is known to me .   Karsin Pesta, MD  

## 2016-12-09 ENCOUNTER — Telehealth: Payer: Self-pay | Admitting: Adult Health

## 2016-12-09 NOTE — Telephone Encounter (Signed)
Patient declined referral to Neuro Rehab he relayed he did not want to schedule.   I asked patient if he wanted me to call and get him an appointment he relayed no. I asked patient would he be inserted in Home PT he declined that as well. Relayed to Patient to call back if he needs anything . Patient understood.

## 2016-12-09 NOTE — Telephone Encounter (Signed)
Patient called office in reference to not hearing back from Physical therapy department.  Please call

## 2017-01-05 DIAGNOSIS — R2689 Other abnormalities of gait and mobility: Secondary | ICD-10-CM | POA: Diagnosis not present

## 2017-01-05 DIAGNOSIS — M6281 Muscle weakness (generalized): Secondary | ICD-10-CM | POA: Diagnosis not present

## 2017-01-05 DIAGNOSIS — R2681 Unsteadiness on feet: Secondary | ICD-10-CM | POA: Diagnosis not present

## 2017-01-05 DIAGNOSIS — R278 Other lack of coordination: Secondary | ICD-10-CM | POA: Diagnosis not present

## 2017-01-05 DIAGNOSIS — R488 Other symbolic dysfunctions: Secondary | ICD-10-CM | POA: Diagnosis not present

## 2017-01-05 DIAGNOSIS — R296 Repeated falls: Secondary | ICD-10-CM | POA: Diagnosis not present

## 2017-01-09 DIAGNOSIS — R278 Other lack of coordination: Secondary | ICD-10-CM | POA: Diagnosis not present

## 2017-01-09 DIAGNOSIS — R2681 Unsteadiness on feet: Secondary | ICD-10-CM | POA: Diagnosis not present

## 2017-01-09 DIAGNOSIS — R488 Other symbolic dysfunctions: Secondary | ICD-10-CM | POA: Diagnosis not present

## 2017-01-09 DIAGNOSIS — R2689 Other abnormalities of gait and mobility: Secondary | ICD-10-CM | POA: Diagnosis not present

## 2017-01-09 DIAGNOSIS — M6281 Muscle weakness (generalized): Secondary | ICD-10-CM | POA: Diagnosis not present

## 2017-01-09 DIAGNOSIS — R296 Repeated falls: Secondary | ICD-10-CM | POA: Diagnosis not present

## 2017-01-10 DIAGNOSIS — M6281 Muscle weakness (generalized): Secondary | ICD-10-CM | POA: Diagnosis not present

## 2017-01-10 DIAGNOSIS — R488 Other symbolic dysfunctions: Secondary | ICD-10-CM | POA: Diagnosis not present

## 2017-01-10 DIAGNOSIS — R296 Repeated falls: Secondary | ICD-10-CM | POA: Diagnosis not present

## 2017-01-10 DIAGNOSIS — R2681 Unsteadiness on feet: Secondary | ICD-10-CM | POA: Diagnosis not present

## 2017-01-10 DIAGNOSIS — R2689 Other abnormalities of gait and mobility: Secondary | ICD-10-CM | POA: Diagnosis not present

## 2017-01-10 DIAGNOSIS — R278 Other lack of coordination: Secondary | ICD-10-CM | POA: Diagnosis not present

## 2017-01-12 DIAGNOSIS — R278 Other lack of coordination: Secondary | ICD-10-CM | POA: Diagnosis not present

## 2017-01-12 DIAGNOSIS — R2681 Unsteadiness on feet: Secondary | ICD-10-CM | POA: Diagnosis not present

## 2017-01-12 DIAGNOSIS — R488 Other symbolic dysfunctions: Secondary | ICD-10-CM | POA: Diagnosis not present

## 2017-01-12 DIAGNOSIS — R2689 Other abnormalities of gait and mobility: Secondary | ICD-10-CM | POA: Diagnosis not present

## 2017-01-12 DIAGNOSIS — R296 Repeated falls: Secondary | ICD-10-CM | POA: Diagnosis not present

## 2017-01-12 DIAGNOSIS — M6281 Muscle weakness (generalized): Secondary | ICD-10-CM | POA: Diagnosis not present

## 2017-01-13 DIAGNOSIS — M6281 Muscle weakness (generalized): Secondary | ICD-10-CM | POA: Diagnosis not present

## 2017-01-13 DIAGNOSIS — R296 Repeated falls: Secondary | ICD-10-CM | POA: Diagnosis not present

## 2017-01-13 DIAGNOSIS — R2689 Other abnormalities of gait and mobility: Secondary | ICD-10-CM | POA: Diagnosis not present

## 2017-01-13 DIAGNOSIS — R2681 Unsteadiness on feet: Secondary | ICD-10-CM | POA: Diagnosis not present

## 2017-01-13 DIAGNOSIS — R488 Other symbolic dysfunctions: Secondary | ICD-10-CM | POA: Diagnosis not present

## 2017-01-13 DIAGNOSIS — R278 Other lack of coordination: Secondary | ICD-10-CM | POA: Diagnosis not present

## 2017-01-16 DIAGNOSIS — R2681 Unsteadiness on feet: Secondary | ICD-10-CM | POA: Diagnosis not present

## 2017-01-16 DIAGNOSIS — R296 Repeated falls: Secondary | ICD-10-CM | POA: Diagnosis not present

## 2017-01-16 DIAGNOSIS — R2243 Localized swelling, mass and lump, lower limb, bilateral: Secondary | ICD-10-CM | POA: Diagnosis not present

## 2017-01-16 DIAGNOSIS — R278 Other lack of coordination: Secondary | ICD-10-CM | POA: Diagnosis not present

## 2017-01-16 DIAGNOSIS — M6281 Muscle weakness (generalized): Secondary | ICD-10-CM | POA: Diagnosis not present

## 2017-01-16 DIAGNOSIS — R488 Other symbolic dysfunctions: Secondary | ICD-10-CM | POA: Diagnosis not present

## 2017-01-16 DIAGNOSIS — R2689 Other abnormalities of gait and mobility: Secondary | ICD-10-CM | POA: Diagnosis not present

## 2017-01-18 DIAGNOSIS — R2681 Unsteadiness on feet: Secondary | ICD-10-CM | POA: Diagnosis not present

## 2017-01-18 DIAGNOSIS — R296 Repeated falls: Secondary | ICD-10-CM | POA: Diagnosis not present

## 2017-01-18 DIAGNOSIS — R2689 Other abnormalities of gait and mobility: Secondary | ICD-10-CM | POA: Diagnosis not present

## 2017-01-18 DIAGNOSIS — R488 Other symbolic dysfunctions: Secondary | ICD-10-CM | POA: Diagnosis not present

## 2017-01-18 DIAGNOSIS — R278 Other lack of coordination: Secondary | ICD-10-CM | POA: Diagnosis not present

## 2017-01-18 DIAGNOSIS — M6281 Muscle weakness (generalized): Secondary | ICD-10-CM | POA: Diagnosis not present

## 2017-01-19 DIAGNOSIS — R2689 Other abnormalities of gait and mobility: Secondary | ICD-10-CM | POA: Diagnosis not present

## 2017-01-19 DIAGNOSIS — R2681 Unsteadiness on feet: Secondary | ICD-10-CM | POA: Diagnosis not present

## 2017-01-19 DIAGNOSIS — R296 Repeated falls: Secondary | ICD-10-CM | POA: Diagnosis not present

## 2017-01-19 DIAGNOSIS — R488 Other symbolic dysfunctions: Secondary | ICD-10-CM | POA: Diagnosis not present

## 2017-01-19 DIAGNOSIS — R278 Other lack of coordination: Secondary | ICD-10-CM | POA: Diagnosis not present

## 2017-01-19 DIAGNOSIS — M6281 Muscle weakness (generalized): Secondary | ICD-10-CM | POA: Diagnosis not present

## 2017-01-23 DIAGNOSIS — M6281 Muscle weakness (generalized): Secondary | ICD-10-CM | POA: Diagnosis not present

## 2017-01-23 DIAGNOSIS — R278 Other lack of coordination: Secondary | ICD-10-CM | POA: Diagnosis not present

## 2017-01-23 DIAGNOSIS — R2681 Unsteadiness on feet: Secondary | ICD-10-CM | POA: Diagnosis not present

## 2017-01-23 DIAGNOSIS — R488 Other symbolic dysfunctions: Secondary | ICD-10-CM | POA: Diagnosis not present

## 2017-01-23 DIAGNOSIS — R296 Repeated falls: Secondary | ICD-10-CM | POA: Diagnosis not present

## 2017-01-23 DIAGNOSIS — R2689 Other abnormalities of gait and mobility: Secondary | ICD-10-CM | POA: Diagnosis not present

## 2017-01-24 DIAGNOSIS — R2689 Other abnormalities of gait and mobility: Secondary | ICD-10-CM | POA: Diagnosis not present

## 2017-01-24 DIAGNOSIS — R296 Repeated falls: Secondary | ICD-10-CM | POA: Diagnosis not present

## 2017-01-24 DIAGNOSIS — M6281 Muscle weakness (generalized): Secondary | ICD-10-CM | POA: Diagnosis not present

## 2017-01-24 DIAGNOSIS — R278 Other lack of coordination: Secondary | ICD-10-CM | POA: Diagnosis not present

## 2017-01-24 DIAGNOSIS — R2681 Unsteadiness on feet: Secondary | ICD-10-CM | POA: Diagnosis not present

## 2017-01-24 DIAGNOSIS — R488 Other symbolic dysfunctions: Secondary | ICD-10-CM | POA: Diagnosis not present

## 2017-01-25 DIAGNOSIS — R278 Other lack of coordination: Secondary | ICD-10-CM | POA: Diagnosis not present

## 2017-01-25 DIAGNOSIS — R2689 Other abnormalities of gait and mobility: Secondary | ICD-10-CM | POA: Diagnosis not present

## 2017-01-25 DIAGNOSIS — M6281 Muscle weakness (generalized): Secondary | ICD-10-CM | POA: Diagnosis not present

## 2017-01-25 DIAGNOSIS — R2681 Unsteadiness on feet: Secondary | ICD-10-CM | POA: Diagnosis not present

## 2017-01-25 DIAGNOSIS — R296 Repeated falls: Secondary | ICD-10-CM | POA: Diagnosis not present

## 2017-01-25 DIAGNOSIS — R488 Other symbolic dysfunctions: Secondary | ICD-10-CM | POA: Diagnosis not present

## 2017-01-26 DIAGNOSIS — R2689 Other abnormalities of gait and mobility: Secondary | ICD-10-CM | POA: Diagnosis not present

## 2017-01-26 DIAGNOSIS — R296 Repeated falls: Secondary | ICD-10-CM | POA: Diagnosis not present

## 2017-01-26 DIAGNOSIS — M6281 Muscle weakness (generalized): Secondary | ICD-10-CM | POA: Diagnosis not present

## 2017-01-26 DIAGNOSIS — R278 Other lack of coordination: Secondary | ICD-10-CM | POA: Diagnosis not present

## 2017-01-26 DIAGNOSIS — R488 Other symbolic dysfunctions: Secondary | ICD-10-CM | POA: Diagnosis not present

## 2017-01-26 DIAGNOSIS — R2681 Unsteadiness on feet: Secondary | ICD-10-CM | POA: Diagnosis not present

## 2017-01-27 DIAGNOSIS — M6281 Muscle weakness (generalized): Secondary | ICD-10-CM | POA: Diagnosis not present

## 2017-01-27 DIAGNOSIS — R2689 Other abnormalities of gait and mobility: Secondary | ICD-10-CM | POA: Diagnosis not present

## 2017-01-27 DIAGNOSIS — R488 Other symbolic dysfunctions: Secondary | ICD-10-CM | POA: Diagnosis not present

## 2017-01-27 DIAGNOSIS — R278 Other lack of coordination: Secondary | ICD-10-CM | POA: Diagnosis not present

## 2017-01-27 DIAGNOSIS — R296 Repeated falls: Secondary | ICD-10-CM | POA: Diagnosis not present

## 2017-01-27 DIAGNOSIS — R2681 Unsteadiness on feet: Secondary | ICD-10-CM | POA: Diagnosis not present

## 2017-01-31 DIAGNOSIS — R488 Other symbolic dysfunctions: Secondary | ICD-10-CM | POA: Diagnosis not present

## 2017-01-31 DIAGNOSIS — M6281 Muscle weakness (generalized): Secondary | ICD-10-CM | POA: Diagnosis not present

## 2017-01-31 DIAGNOSIS — R2681 Unsteadiness on feet: Secondary | ICD-10-CM | POA: Diagnosis not present

## 2017-01-31 DIAGNOSIS — R278 Other lack of coordination: Secondary | ICD-10-CM | POA: Diagnosis not present

## 2017-01-31 DIAGNOSIS — R296 Repeated falls: Secondary | ICD-10-CM | POA: Diagnosis not present

## 2017-01-31 DIAGNOSIS — R2689 Other abnormalities of gait and mobility: Secondary | ICD-10-CM | POA: Diagnosis not present

## 2017-02-01 ENCOUNTER — Telehealth: Payer: Self-pay | Admitting: Neurology

## 2017-02-01 DIAGNOSIS — R2689 Other abnormalities of gait and mobility: Secondary | ICD-10-CM | POA: Diagnosis not present

## 2017-02-01 DIAGNOSIS — R488 Other symbolic dysfunctions: Secondary | ICD-10-CM | POA: Diagnosis not present

## 2017-02-01 DIAGNOSIS — R2681 Unsteadiness on feet: Secondary | ICD-10-CM | POA: Diagnosis not present

## 2017-02-01 DIAGNOSIS — R278 Other lack of coordination: Secondary | ICD-10-CM | POA: Diagnosis not present

## 2017-02-01 DIAGNOSIS — R296 Repeated falls: Secondary | ICD-10-CM | POA: Diagnosis not present

## 2017-02-01 DIAGNOSIS — M6281 Muscle weakness (generalized): Secondary | ICD-10-CM | POA: Diagnosis not present

## 2017-02-01 NOTE — Telephone Encounter (Signed)
[facility] [phone] called the office the pt's BP is elevated today - 175/69, he is not symptomatic. She will contact PCP  FYI

## 2017-02-02 DIAGNOSIS — R278 Other lack of coordination: Secondary | ICD-10-CM | POA: Diagnosis not present

## 2017-02-02 DIAGNOSIS — R2689 Other abnormalities of gait and mobility: Secondary | ICD-10-CM | POA: Diagnosis not present

## 2017-02-02 DIAGNOSIS — R2681 Unsteadiness on feet: Secondary | ICD-10-CM | POA: Diagnosis not present

## 2017-02-02 DIAGNOSIS — R488 Other symbolic dysfunctions: Secondary | ICD-10-CM | POA: Diagnosis not present

## 2017-02-02 DIAGNOSIS — R296 Repeated falls: Secondary | ICD-10-CM | POA: Diagnosis not present

## 2017-02-02 DIAGNOSIS — M6281 Muscle weakness (generalized): Secondary | ICD-10-CM | POA: Diagnosis not present

## 2017-02-03 DIAGNOSIS — R2681 Unsteadiness on feet: Secondary | ICD-10-CM | POA: Diagnosis not present

## 2017-02-03 DIAGNOSIS — R296 Repeated falls: Secondary | ICD-10-CM | POA: Diagnosis not present

## 2017-02-03 DIAGNOSIS — R488 Other symbolic dysfunctions: Secondary | ICD-10-CM | POA: Diagnosis not present

## 2017-02-03 DIAGNOSIS — R278 Other lack of coordination: Secondary | ICD-10-CM | POA: Diagnosis not present

## 2017-02-03 DIAGNOSIS — M6281 Muscle weakness (generalized): Secondary | ICD-10-CM | POA: Diagnosis not present

## 2017-02-03 DIAGNOSIS — R2689 Other abnormalities of gait and mobility: Secondary | ICD-10-CM | POA: Diagnosis not present

## 2017-02-06 DIAGNOSIS — R488 Other symbolic dysfunctions: Secondary | ICD-10-CM | POA: Diagnosis not present

## 2017-02-06 DIAGNOSIS — R296 Repeated falls: Secondary | ICD-10-CM | POA: Diagnosis not present

## 2017-02-06 DIAGNOSIS — R2689 Other abnormalities of gait and mobility: Secondary | ICD-10-CM | POA: Diagnosis not present

## 2017-02-06 DIAGNOSIS — R2681 Unsteadiness on feet: Secondary | ICD-10-CM | POA: Diagnosis not present

## 2017-02-06 DIAGNOSIS — R278 Other lack of coordination: Secondary | ICD-10-CM | POA: Diagnosis not present

## 2017-02-06 DIAGNOSIS — M6281 Muscle weakness (generalized): Secondary | ICD-10-CM | POA: Diagnosis not present

## 2017-02-07 DIAGNOSIS — R278 Other lack of coordination: Secondary | ICD-10-CM | POA: Diagnosis not present

## 2017-02-07 DIAGNOSIS — R2689 Other abnormalities of gait and mobility: Secondary | ICD-10-CM | POA: Diagnosis not present

## 2017-02-07 DIAGNOSIS — R296 Repeated falls: Secondary | ICD-10-CM | POA: Diagnosis not present

## 2017-02-07 DIAGNOSIS — R2681 Unsteadiness on feet: Secondary | ICD-10-CM | POA: Diagnosis not present

## 2017-02-07 DIAGNOSIS — R488 Other symbolic dysfunctions: Secondary | ICD-10-CM | POA: Diagnosis not present

## 2017-02-07 DIAGNOSIS — M6281 Muscle weakness (generalized): Secondary | ICD-10-CM | POA: Diagnosis not present

## 2017-02-08 DIAGNOSIS — R488 Other symbolic dysfunctions: Secondary | ICD-10-CM | POA: Diagnosis not present

## 2017-02-08 DIAGNOSIS — R2689 Other abnormalities of gait and mobility: Secondary | ICD-10-CM | POA: Diagnosis not present

## 2017-02-08 DIAGNOSIS — H524 Presbyopia: Secondary | ICD-10-CM | POA: Diagnosis not present

## 2017-02-08 DIAGNOSIS — R278 Other lack of coordination: Secondary | ICD-10-CM | POA: Diagnosis not present

## 2017-02-08 DIAGNOSIS — M6281 Muscle weakness (generalized): Secondary | ICD-10-CM | POA: Diagnosis not present

## 2017-02-08 DIAGNOSIS — R2681 Unsteadiness on feet: Secondary | ICD-10-CM | POA: Diagnosis not present

## 2017-02-08 DIAGNOSIS — R296 Repeated falls: Secondary | ICD-10-CM | POA: Diagnosis not present

## 2017-02-13 DIAGNOSIS — R2681 Unsteadiness on feet: Secondary | ICD-10-CM | POA: Diagnosis not present

## 2017-02-13 DIAGNOSIS — R296 Repeated falls: Secondary | ICD-10-CM | POA: Diagnosis not present

## 2017-02-13 DIAGNOSIS — R2689 Other abnormalities of gait and mobility: Secondary | ICD-10-CM | POA: Diagnosis not present

## 2017-02-13 DIAGNOSIS — R278 Other lack of coordination: Secondary | ICD-10-CM | POA: Diagnosis not present

## 2017-02-13 DIAGNOSIS — M6281 Muscle weakness (generalized): Secondary | ICD-10-CM | POA: Diagnosis not present

## 2017-02-13 DIAGNOSIS — R488 Other symbolic dysfunctions: Secondary | ICD-10-CM | POA: Diagnosis not present

## 2017-02-14 DIAGNOSIS — R2681 Unsteadiness on feet: Secondary | ICD-10-CM | POA: Diagnosis not present

## 2017-02-14 DIAGNOSIS — R2689 Other abnormalities of gait and mobility: Secondary | ICD-10-CM | POA: Diagnosis not present

## 2017-02-14 DIAGNOSIS — R278 Other lack of coordination: Secondary | ICD-10-CM | POA: Diagnosis not present

## 2017-02-14 DIAGNOSIS — R488 Other symbolic dysfunctions: Secondary | ICD-10-CM | POA: Diagnosis not present

## 2017-02-14 DIAGNOSIS — M6281 Muscle weakness (generalized): Secondary | ICD-10-CM | POA: Diagnosis not present

## 2017-02-14 DIAGNOSIS — R296 Repeated falls: Secondary | ICD-10-CM | POA: Diagnosis not present

## 2017-02-16 DIAGNOSIS — R296 Repeated falls: Secondary | ICD-10-CM | POA: Diagnosis not present

## 2017-02-16 DIAGNOSIS — R278 Other lack of coordination: Secondary | ICD-10-CM | POA: Diagnosis not present

## 2017-02-16 DIAGNOSIS — M6281 Muscle weakness (generalized): Secondary | ICD-10-CM | POA: Diagnosis not present

## 2017-02-16 DIAGNOSIS — R2689 Other abnormalities of gait and mobility: Secondary | ICD-10-CM | POA: Diagnosis not present

## 2017-02-16 DIAGNOSIS — R2681 Unsteadiness on feet: Secondary | ICD-10-CM | POA: Diagnosis not present

## 2017-02-16 DIAGNOSIS — R488 Other symbolic dysfunctions: Secondary | ICD-10-CM | POA: Diagnosis not present

## 2017-02-20 DIAGNOSIS — R2689 Other abnormalities of gait and mobility: Secondary | ICD-10-CM | POA: Diagnosis not present

## 2017-02-20 DIAGNOSIS — R296 Repeated falls: Secondary | ICD-10-CM | POA: Diagnosis not present

## 2017-02-20 DIAGNOSIS — R488 Other symbolic dysfunctions: Secondary | ICD-10-CM | POA: Diagnosis not present

## 2017-02-20 DIAGNOSIS — M6281 Muscle weakness (generalized): Secondary | ICD-10-CM | POA: Diagnosis not present

## 2017-02-20 DIAGNOSIS — R278 Other lack of coordination: Secondary | ICD-10-CM | POA: Diagnosis not present

## 2017-02-20 DIAGNOSIS — R2681 Unsteadiness on feet: Secondary | ICD-10-CM | POA: Diagnosis not present

## 2017-02-22 DIAGNOSIS — R2681 Unsteadiness on feet: Secondary | ICD-10-CM | POA: Diagnosis not present

## 2017-02-22 DIAGNOSIS — R488 Other symbolic dysfunctions: Secondary | ICD-10-CM | POA: Diagnosis not present

## 2017-02-22 DIAGNOSIS — M6281 Muscle weakness (generalized): Secondary | ICD-10-CM | POA: Diagnosis not present

## 2017-02-22 DIAGNOSIS — R296 Repeated falls: Secondary | ICD-10-CM | POA: Diagnosis not present

## 2017-02-22 DIAGNOSIS — R278 Other lack of coordination: Secondary | ICD-10-CM | POA: Diagnosis not present

## 2017-02-22 DIAGNOSIS — R2689 Other abnormalities of gait and mobility: Secondary | ICD-10-CM | POA: Diagnosis not present

## 2017-02-23 DIAGNOSIS — R488 Other symbolic dysfunctions: Secondary | ICD-10-CM | POA: Diagnosis not present

## 2017-02-23 DIAGNOSIS — R278 Other lack of coordination: Secondary | ICD-10-CM | POA: Diagnosis not present

## 2017-02-23 DIAGNOSIS — R296 Repeated falls: Secondary | ICD-10-CM | POA: Diagnosis not present

## 2017-02-23 DIAGNOSIS — R2689 Other abnormalities of gait and mobility: Secondary | ICD-10-CM | POA: Diagnosis not present

## 2017-02-23 DIAGNOSIS — R2681 Unsteadiness on feet: Secondary | ICD-10-CM | POA: Diagnosis not present

## 2017-02-23 DIAGNOSIS — M6281 Muscle weakness (generalized): Secondary | ICD-10-CM | POA: Diagnosis not present

## 2017-03-01 DIAGNOSIS — R296 Repeated falls: Secondary | ICD-10-CM | POA: Diagnosis not present

## 2017-03-01 DIAGNOSIS — R2689 Other abnormalities of gait and mobility: Secondary | ICD-10-CM | POA: Diagnosis not present

## 2017-03-06 DIAGNOSIS — W19XXXA Unspecified fall, initial encounter: Secondary | ICD-10-CM | POA: Diagnosis not present

## 2017-03-06 DIAGNOSIS — S0003XA Contusion of scalp, initial encounter: Secondary | ICD-10-CM | POA: Diagnosis not present

## 2017-03-06 DIAGNOSIS — S0191XA Laceration without foreign body of unspecified part of head, initial encounter: Secondary | ICD-10-CM | POA: Diagnosis not present

## 2017-03-06 DIAGNOSIS — I10 Essential (primary) hypertension: Secondary | ICD-10-CM | POA: Diagnosis not present

## 2017-03-07 DIAGNOSIS — R2689 Other abnormalities of gait and mobility: Secondary | ICD-10-CM | POA: Diagnosis not present

## 2017-03-07 DIAGNOSIS — R296 Repeated falls: Secondary | ICD-10-CM | POA: Diagnosis not present

## 2017-03-08 ENCOUNTER — Telehealth: Payer: Self-pay | Admitting: Neurology

## 2017-03-08 NOTE — Telephone Encounter (Signed)
Called the patients son to reschedule the apt. We were able to see him over a lunch break on Sept 25 at 12:15 pm. Pt son verbalized understanding
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
## 2017-03-08 NOTE — Telephone Encounter (Signed)
Pt son has called back re: appointment that was cancelled, pt son states that he needs to be seen asap and although he agreed to 1st available appointment 07-02-17 he would like RN to call if there is anyway to get him seen by Dr Brett Fairy before.  Please call pt son(which is on DPR)

## 2017-03-09 ENCOUNTER — Ambulatory Visit: Payer: PPO | Admitting: Neurology

## 2017-03-09 DIAGNOSIS — R2689 Other abnormalities of gait and mobility: Secondary | ICD-10-CM | POA: Diagnosis not present

## 2017-03-09 DIAGNOSIS — R296 Repeated falls: Secondary | ICD-10-CM | POA: Diagnosis not present

## 2017-03-15 DIAGNOSIS — R2689 Other abnormalities of gait and mobility: Secondary | ICD-10-CM | POA: Diagnosis not present

## 2017-03-15 DIAGNOSIS — R296 Repeated falls: Secondary | ICD-10-CM | POA: Diagnosis not present

## 2017-03-16 DIAGNOSIS — R296 Repeated falls: Secondary | ICD-10-CM | POA: Diagnosis not present

## 2017-03-16 DIAGNOSIS — R2689 Other abnormalities of gait and mobility: Secondary | ICD-10-CM | POA: Diagnosis not present

## 2017-03-21 ENCOUNTER — Encounter: Payer: Self-pay | Admitting: Neurology

## 2017-03-21 ENCOUNTER — Ambulatory Visit (INDEPENDENT_AMBULATORY_CARE_PROVIDER_SITE_OTHER): Payer: PPO | Admitting: Neurology

## 2017-03-21 VITALS — BP 155/71 | HR 54 | Ht 67.0 in | Wt 136.0 lb

## 2017-03-21 DIAGNOSIS — G20 Parkinson's disease: Secondary | ICD-10-CM | POA: Diagnosis not present

## 2017-03-21 MED ORDER — CARBIDOPA-LEVODOPA 25-100 MG PO TABS
1.0000 | ORAL_TABLET | Freq: Three times a day (TID) | ORAL | 2 refills | Status: DC
Start: 2017-03-21 — End: 2017-05-21

## 2017-03-21 MED ORDER — ROPINIROLE HCL ER 2 MG PO TB24: 2.0000 mg | ORAL_TABLET | Freq: Every day | ORAL | 5 refills | Status: DC

## 2017-03-21 NOTE — Progress Notes (Addendum)
PATIENT: Austin Juarez DOB: 04-24-1930  REASON FOR VISIT: follow up- parkinson's disease HISTORY FROM: patient  HISTORY OF PRESENT ILLNESS:  03-21-2017 ,CD Mr. Austin Juarez is a 83 year old Caucasian right-handed gentleman with a history of Parkinson's disease. He was recently tried on Requip 3 times a day but this caused micro-sleep attacks, he also had progressive falls more and more frequent. Physical therapy is now involved in at home care he did not meet occupational therapy services but was evaluated for such. He reports that his memory appears not as good and that he sometimes forgets to take his medication so one of our concerns is to start him on a regimen that allows extended release forms of medication. He demonstrated more problems with ambulation, has very small steps a shuffling g difficulties to raise from a seated position, difficulties to turn. Sitting back on the chair was also very.difficult for him. All his falls were retropulsive.    Austin Juarez is an 83 year old male with a history of Parkinson's disease. He returns today for follow-up. He is currently taking Requip 2 times a day. This has been prescribed 3 times a day however he reports that he is only been doing it twice a day. He reports that he is having more difficulty with ambulation and falls. He states that he has approximately 6 falls since the last visit. Fortunately has not suffered any injuries. He feels that the tremor has remained stable. Reports he has a tremor in both hands does not feel that one side is worse than other. He reports some difficulty swallowing but denies choking on food or liquids. He reports that he is sleeping okay. His wife has dementia so he tries to assist her. He is not using an assistive device when ambulating. He returns today for an evaluation.  HISTORY 03/16/15:  Austin Juarez is a 83 year old male with a history of Parkinson's disease. He returns today for follow-up. The patient is  currently taking Requip 3 times a day. He feels that this medication has been beneficial. He denies any significant changes since the last visit. Denies any changes with his gait or balance. He does state that he has learned not to make turns very quickly. He does not use a walker or cane when ambulating. Denies any falls. He states that his tremor is primarily located in the left hand. He denies any changes with his swallowing. He states that if he takes a big bite of food this sometimes causes him some issues. Denies any changes with his sleep. He does state that he gets up typically 3 times a night to urinate. Overall he feels that things have maintained. He returns today for an evaluation.  HISTORY  09/17/14 (Georgiana Spillane): Austin Juarez is a 83 y.o. male Is seen here as a referral/ revisit from [place] , for Tremor and muscle rigor, thought to reflect Parkinson's disease. The patient has been last seen in this office by NP. The patient continues to have a mild jaw tremor, a right-hand dominant tremor or is not seen today at rest. He does have several dermatitis and a masked face. He continues to play golf and is encouraged to continue physical activity. He is taking care of his granddaughter, who lives with them. During my last visit with the patient on 03-29-2012 the patient did better on Mirapex but due to the expense correlated with his medication stayed on twice a day dosing and did not use it 3 times a day. We waited  for January of this year when his prescription but unchanged. Laps and EEGs in the last visit were normal an imaging study was scheduled. He remains independent in all activities of daily living. He had noticed some bruising in the past from daily baby aspirin him and has for that reason stopped the aspirin. However, I would like for him to try to take the aspirin every other day to have some of the vascular protective benefits of the medication.  09-17-14 CD Visible resting tremor,  right hand dominant with a slight jaw tremor. The patient has some dysphonia but reports no dysphagia. Today's [facility] cognitive assessment test scored at 24 out of 30 points in the last visit he scored 26 out of 30 points. I think he lost his points because his fine motor skills were worse. He is fully oriented but he lost 3 of the 5 recall words. One point and obstruction, and one point in word fluency. He did the serial 7 the attention to words and the digit listing. He is feeling he gets worse, He lives no alone with his wife, his granddaughter "Roselyn Reef "who used to reside with him has moved to [place]. His grandchildren are orphans and have alcohol embryopathy.      REVIEW OF SYSTEMS: Out of a complete 14 system review of symptoms, the patient complains only of the following symptoms, and all other reviewed systems are negative.  Memory loss, dizziness, tremors, walking difficulty  ALLERGIES: Allergies  Allergen Reactions  . Flomax [Tamsulosin Hcl] Nausea Only    Violently   . Morphine And Related     nausea    HOME MEDICATIONS: Outpatient Medications Prior to Visit  Medication Sig Dispense Refill  . polyethylene glycol powder (GLYCOLAX/MIRALAX) powder Take by mouth as needed.     Marland Kitchen rOPINIRole (REQUIP) 0.25 MG tablet TAKE 1 TABLET (0.25 MG TOTAL) BY MOUTH 3 (THREE) TIMES DAILY. 90 tablet 5   No facility-administered medications prior to visit.     PAST MEDICAL HISTORY: Past Medical History:  Diagnosis Date  . Hypertension   . Kidney stones   . PD (Parkinson's disease) ([place])     PAST SURGICAL HISTORY: Past Surgical History:  Procedure Laterality Date  . ARM SURGERY Left    CANCER    FAMILY HISTORY: Family History  Problem Relation Age of Onset  . Alcoholism Daughter     SOCIAL HISTORY: Social History   Social History  . Marital status: Married    Spouse name: [name]  . Number of children: 2  . Years of education: 16   Occupational History  . retired     Social History Main Topics  . Smoking status: Never Smoker  . Smokeless tobacco: Never Used  . Alcohol use No  . Drug use: No  . Sexual activity: Not on file   Other Topics Concern  . Not on file   Social History Narrative   Patient lives at home with wife [name].   Patient has 3 children. 1 deceased.    Patient is retired.    Patient is right handed.    Patient has a college degree.    Caffeine 2 cups coffee daily.             PHYSICAL EXAM  Vitals:   03/21/17 1209  BP: (!) 155/71  Pulse: (!) 54  Weight: 136 lb (61.7 kg)  Height: 5\' 7"  (1.702 m)   Body mass index is 21.3 kg/m.  Generalized: Well developed, in no  acute distress   mini-mental status exam - 28/ 30 points, micrographia.    Neurological examination  Mentation: Alert oriented to time, place, history taking. Follows slowly, dysphonia, stooped posture.  Masked face  Cranial nerve : Loss of smell, not taste. Pupils were equal round reactive to light. Extraocular movements were full, visual field were full on confrontational test. Facial sensation and strength were normal. Uvula tongue midline. Head turning and shoulder shrug  were normal and symmetric. Motor: rigor, cog-wheeling. Mild Resting Tremor. Coordination: micrographia.    Patient is unable to stand , to rise form a seated positionwithout assistance. He braces, he produces very small steps, turns with more than 8 steps, tends to retropulse. The patient has decreased armswing bilaterally. Decreased stride.  Difficulty with turns usually taking 6 or more steps. Romberg is positive .   DIAGNOSTIC DATA (LABS, IMAGING, TESTING) - I reviewed patient records, labs, notes, testing and imaging myself where available.  CKD,   Lab Results  Component Value Date   WBC 5.7 08/20/2015   HGB 13.1 08/20/2015   HCT 37.8 (L) 08/20/2015   MCV 99.2 08/20/2015   PLT 127 (L) 08/20/2015      Component Value Date/Time   NA 143 08/20/2015 1421   K 4.3  08/20/2015 1421   CL 107 08/20/2015 1421   CO2 25 08/20/2015 1421   GLUCOSE 115 (H) 08/20/2015 1421   BUN 32 (H) 08/20/2015 1421   CREATININE 1.50 (H) 08/20/2015 1421   CALCIUM 9.4 08/20/2015 1421   GFRNONAA 41 (L) 08/20/2015 1421   GFRAA 48 (L) 08/20/2015 1421      ASSESSMENT AND PLAN 83 y.o. year old male  has a past medical history of Hypertension; Kidney stones; and PD (Parkinson's disease) ([place]). here with :  1. Parkinson's disease with excessive sleepiness on Requip. Change to sinemet.  2. Memory impairment reported  - forgets to take medication.  MOCA next visit. He aced the MMSE 28/30 !  .  2. Frequent falls, PD related.  Takes mini steps, has retropulsion, positive romberg. He has a walker.   Let's try an XR form of Requip for the night time.  Mr. Kervin  has services through [facility] for PT , completed OT .  Patient will now stay on Sinemet 25/100 mg tid.   Patient will follow-up in 3 months with NP.  patient declined speech therapy at this time.   Larey Seat, MD    03/21/2017, 12:58 PM [facility] [address], [place], [place] [address] [phone]

## 2017-03-21 NOTE — Patient Instructions (Signed)
Carbidopa; Levodopa tablets What is this medicine? CARBIDOPA;LEVODOPA (kar bi DOE pa; lee voe DOE pa) is used to treat the symptoms of Parkinson's disease. This medicine may be used for other purposes; ask your health care provider or pharmacist if you have questions. COMMON BRAND NAME(S): Atamet, SINEMET What should I tell my health care provider before I take this medicine? They need to know if you have any of these conditions: -asthma or lung disease -depression or other mental illness -diabetes -glaucoma -heart disease, including history of a heart attack -irregular heart beat -kidney or liver disease -melanoma or suspicious skin lesions -stomach or intestine ulcers -an unusual or allergic reaction to levodopa, carbidopa, other medicines, foods, dyes, or preservatives -pregnant or trying to get pregnant -breast-feeding How should I use this medicine? Take this medicine by mouth with a glass of water. Follow the directions on the prescription label. Take your doses at regular intervals. Do not take your medicine more often than directed. Do not stop taking except on the advice of your doctor or health care professional. Talk to your pediatrician regarding the use of this medicine in children. Special care may be needed. Overdosage: If you think you have taken too much of this medicine contact a poison control center or emergency room at once. NOTE: This medicine is only for you. Do not share this medicine with others. What if I miss a dose? If you miss a dose, take it as soon as you can. If it is almost time for your next dose, take only that dose. Do not take double or extra doses. What may interact with this medicine? Do not take this medicine with any of the following medications: -MAOIs like Marplan, Nardil, and Parnate -reserpine -tetrabenazine This medicine may also interact with the following medications: -alcohol -droperidol -entacapone -iron supplements or multivitamins  with iron -isoniazid, INH -linezolid -medicines for depression, anxiety, or psychotic disturbances -medicines for high blood pressure -medicines for sleep -metoclopramide -papaverine -procarbazine -tedizolid -rasagiline -selegiline -tolcapone This list may not describe all possible interactions. Give your health care provider a list of all the medicines, herbs, non-prescription drugs, or dietary supplements you use. Also tell them if you smoke, drink alcohol, or use illegal drugs. Some items may interact with your medicine. What should I watch for while using this medicine? Visit your doctor or health care professional for regular checks on your progress. It may be several weeks or months before you feel the full benefits of this medicine. Continue to take your medicine on a regular schedule. Do not take any additional medicines for Parkinson's disease without first consulting with your health care provider. You may experience a wearing of effect prior to the time for your next dose of this medicine. You may also experience an on-off effect where the medicine apparently stops working for anything from a minute to several hours, then suddenly starts working again. Tell your doctor or health care professional if any of these symptoms happen to you. Your dose may need to be changed. A high protein diet can slow or prevent absorption of this medicine. Avoid high protein foods near the time of taking this medicine to help to prevent these problems. Take this medicine at least 30 minutes before eating or one hour after meals. You may want to eat higher protein foods later in the day or in small amounts. Discuss your diet with your doctor or health care professional or nutritionist. You may get drowsy or dizzy. Do not drive, use machinery,  or do anything that needs mental alertness until you know how this drug affects you. Do not stand or sit up quickly, especially if you are an older patient. This  reduces the risk of dizzy or fainting spells. Alcohol can make you more drowsy and dizzy. Avoid alcoholic drinks. If you find that you have sudden feelings of wanting to sleep during normal activities, like cooking, watching television, or while driving or riding in a car, you should contact your health care professional. If you are diabetic, this medicine may interfere with the accuracy of some tests for sugar or ketones in the urine (does not interfere with blood tests). Check with your doctor or health care professional before changing the dose of your diabetic medicine. This medicine may discolor the urine or sweat, making it look darker or red in color. This is of no cause for concern. However, this may stain clothing or fabrics. There have been reports of increased sexual urges or other strong urges such as gambling while taking some medicines for Parkinson's disease. If you experience any of these urges while taking this medicine, you should report it to your health care provider as soon as possible. You should check your skin often for changes to moles and new growths while taking this medicine. Call your doctor if you notice any of these changes. What side effects may I notice from receiving this medicine? Side effects that you should report to your doctor or health care professional as soon as possible: -allergic reactions like skin rash, itching or hives, swelling of the face, lips, or tongue -anxiety, confusion, or nervousness -falling asleep during normal activities like driving -fast, irregular heartbeat -hallucination, loss of contact with reality -mood changes like aggressive behavior, depression -stomach pain -trouble passing urine -uncontrolled movements of the mouth, head, hands, feet, shoulders, eyelids or other unusual muscle movements Side effects that usually do not require medical attention (report to your doctor or health care professional if they continue or are  bothersome): -headache -loss of appetite -muscle twitches -nausea, vomiting -nightmares, trouble sleeping -unusually weak or tired This list may not describe all possible side effects. Call your doctor for medical advice about side effects. You may report side effects to FDA at 1-800-FDA-1088. Where should I keep my medicine? Keep out of the reach of children. Store at room temperature between 15 and 30 degrees C (59 and 86 degrees F). Protect from light. Throw away any unused medicine after the expiration date. NOTE: This sheet is a summary. It may not cover all possible information. If you have questions about this medicine, talk to your doctor, pharmacist, or health care provider.  2018 Elsevier/Gold Standard (2013-08-13 15:41:53)  You have started on Requip XR 2 mg, a medication to take at night by mouth  You also have been prescribed sinemet , 25/100 mg tid before each meal.  You will see Np in Follow up in the next 2-3 month. This is meant to access your sleepiness , memory and movement.

## 2017-03-22 DIAGNOSIS — R2689 Other abnormalities of gait and mobility: Secondary | ICD-10-CM | POA: Diagnosis not present

## 2017-03-22 DIAGNOSIS — R296 Repeated falls: Secondary | ICD-10-CM | POA: Diagnosis not present

## 2017-03-24 DIAGNOSIS — R296 Repeated falls: Secondary | ICD-10-CM | POA: Diagnosis not present

## 2017-03-24 DIAGNOSIS — R2689 Other abnormalities of gait and mobility: Secondary | ICD-10-CM | POA: Diagnosis not present

## 2017-03-27 DIAGNOSIS — S61411A Laceration without foreign body of right hand, initial encounter: Secondary | ICD-10-CM | POA: Diagnosis not present

## 2017-03-30 DIAGNOSIS — R296 Repeated falls: Secondary | ICD-10-CM | POA: Diagnosis not present

## 2017-03-30 DIAGNOSIS — R2689 Other abnormalities of gait and mobility: Secondary | ICD-10-CM | POA: Diagnosis not present

## 2017-04-03 DIAGNOSIS — S61411D Laceration without foreign body of right hand, subsequent encounter: Secondary | ICD-10-CM | POA: Diagnosis not present

## 2017-04-06 DIAGNOSIS — R296 Repeated falls: Secondary | ICD-10-CM | POA: Diagnosis not present

## 2017-04-06 DIAGNOSIS — R2689 Other abnormalities of gait and mobility: Secondary | ICD-10-CM | POA: Diagnosis not present

## 2017-04-12 ENCOUNTER — Telehealth: Payer: Self-pay | Admitting: Neurology

## 2017-04-12 ENCOUNTER — Other Ambulatory Visit: Payer: Self-pay | Admitting: Neurology

## 2017-04-12 DIAGNOSIS — G4719 Other hypersomnia: Secondary | ICD-10-CM

## 2017-04-12 NOTE — Telephone Encounter (Signed)
Called the patient's son back. Pt states that since last visit they had started the patient on sinemet and changed the requip to a XL. Pt since taking these medications is experiencing more dizziness and sleepiness. He is unsure which medication or if its both together could be causing the symptoms. He is asking what Dr Brett Fairy recommends on how to help. I informed the patient's son that I will ask Dr. Brett Fairy and get back in touch with him once she makes a recommendation.

## 2017-04-12 NOTE — Telephone Encounter (Signed)
Pt's son called he is having a hard time staying awake since starting carbidopa-levodopa (SINEMET IR) 25-100 MG tablet and increasingrOPINIRole (REQUIP XL) 2 MG 24 hr tablet  . Please call to discuss

## 2017-04-12 NOTE — Telephone Encounter (Signed)
Called the patients son and made him aware that after talking with Dr Brett Fairy she has recommended that we stop Requip XL and see how he tolerates just taking the Sinemet. She would also like to order a Home sleep Test to make sure there is no other causes for his sleepiness. Pt's son verbalized understanding and had no further questions.

## 2017-04-13 DIAGNOSIS — R296 Repeated falls: Secondary | ICD-10-CM | POA: Diagnosis not present

## 2017-04-13 DIAGNOSIS — R2689 Other abnormalities of gait and mobility: Secondary | ICD-10-CM | POA: Diagnosis not present

## 2017-04-18 DIAGNOSIS — R296 Repeated falls: Secondary | ICD-10-CM | POA: Diagnosis not present

## 2017-04-18 DIAGNOSIS — R2689 Other abnormalities of gait and mobility: Secondary | ICD-10-CM | POA: Diagnosis not present

## 2017-04-20 DIAGNOSIS — Z85820 Personal history of malignant melanoma of skin: Secondary | ICD-10-CM | POA: Diagnosis not present

## 2017-04-20 DIAGNOSIS — L812 Freckles: Secondary | ICD-10-CM | POA: Diagnosis not present

## 2017-04-20 DIAGNOSIS — L570 Actinic keratosis: Secondary | ICD-10-CM | POA: Diagnosis not present

## 2017-04-20 DIAGNOSIS — Z85828 Personal history of other malignant neoplasm of skin: Secondary | ICD-10-CM | POA: Diagnosis not present

## 2017-04-20 DIAGNOSIS — R296 Repeated falls: Secondary | ICD-10-CM | POA: Diagnosis not present

## 2017-04-20 DIAGNOSIS — L821 Other seborrheic keratosis: Secondary | ICD-10-CM | POA: Diagnosis not present

## 2017-04-20 DIAGNOSIS — D1801 Hemangioma of skin and subcutaneous tissue: Secondary | ICD-10-CM | POA: Diagnosis not present

## 2017-04-20 DIAGNOSIS — L308 Other specified dermatitis: Secondary | ICD-10-CM | POA: Diagnosis not present

## 2017-04-20 DIAGNOSIS — R2689 Other abnormalities of gait and mobility: Secondary | ICD-10-CM | POA: Diagnosis not present

## 2017-04-25 DIAGNOSIS — R296 Repeated falls: Secondary | ICD-10-CM | POA: Diagnosis not present

## 2017-04-25 DIAGNOSIS — R2689 Other abnormalities of gait and mobility: Secondary | ICD-10-CM | POA: Diagnosis not present

## 2017-04-26 ENCOUNTER — Telehealth: Payer: Self-pay

## 2017-04-26 NOTE — Telephone Encounter (Signed)
Called patient to schedule HST but did not want to do study. He will call back if he changes his mind.

## 2017-04-27 DIAGNOSIS — R2689 Other abnormalities of gait and mobility: Secondary | ICD-10-CM | POA: Diagnosis not present

## 2017-04-27 DIAGNOSIS — R296 Repeated falls: Secondary | ICD-10-CM | POA: Diagnosis not present

## 2017-05-02 DIAGNOSIS — R296 Repeated falls: Secondary | ICD-10-CM | POA: Diagnosis not present

## 2017-05-02 DIAGNOSIS — R2689 Other abnormalities of gait and mobility: Secondary | ICD-10-CM | POA: Diagnosis not present

## 2017-05-04 DIAGNOSIS — R2689 Other abnormalities of gait and mobility: Secondary | ICD-10-CM | POA: Diagnosis not present

## 2017-05-04 DIAGNOSIS — R296 Repeated falls: Secondary | ICD-10-CM | POA: Diagnosis not present

## 2017-05-09 DIAGNOSIS — R296 Repeated falls: Secondary | ICD-10-CM | POA: Diagnosis not present

## 2017-05-09 DIAGNOSIS — R2689 Other abnormalities of gait and mobility: Secondary | ICD-10-CM | POA: Diagnosis not present

## 2017-05-11 DIAGNOSIS — R2689 Other abnormalities of gait and mobility: Secondary | ICD-10-CM | POA: Diagnosis not present

## 2017-05-11 DIAGNOSIS — R296 Repeated falls: Secondary | ICD-10-CM | POA: Diagnosis not present

## 2017-05-19 DIAGNOSIS — R2689 Other abnormalities of gait and mobility: Secondary | ICD-10-CM | POA: Diagnosis not present

## 2017-05-19 DIAGNOSIS — R296 Repeated falls: Secondary | ICD-10-CM | POA: Diagnosis not present

## 2017-05-21 ENCOUNTER — Other Ambulatory Visit: Payer: Self-pay | Admitting: Neurology

## 2017-05-22 DIAGNOSIS — R296 Repeated falls: Secondary | ICD-10-CM | POA: Diagnosis not present

## 2017-05-22 DIAGNOSIS — R2689 Other abnormalities of gait and mobility: Secondary | ICD-10-CM | POA: Diagnosis not present

## 2017-05-22 DIAGNOSIS — S0101XA Laceration without foreign body of scalp, initial encounter: Secondary | ICD-10-CM | POA: Diagnosis not present

## 2017-05-24 DIAGNOSIS — R2689 Other abnormalities of gait and mobility: Secondary | ICD-10-CM | POA: Diagnosis not present

## 2017-05-24 DIAGNOSIS — R296 Repeated falls: Secondary | ICD-10-CM | POA: Diagnosis not present

## 2017-05-29 DIAGNOSIS — S0101XD Laceration without foreign body of scalp, subsequent encounter: Secondary | ICD-10-CM | POA: Diagnosis not present

## 2017-05-29 DIAGNOSIS — R2689 Other abnormalities of gait and mobility: Secondary | ICD-10-CM | POA: Diagnosis not present

## 2017-05-29 DIAGNOSIS — Z4802 Encounter for removal of sutures: Secondary | ICD-10-CM | POA: Diagnosis not present

## 2017-05-29 DIAGNOSIS — R296 Repeated falls: Secondary | ICD-10-CM | POA: Diagnosis not present

## 2017-06-01 DIAGNOSIS — R2689 Other abnormalities of gait and mobility: Secondary | ICD-10-CM | POA: Diagnosis not present

## 2017-06-01 DIAGNOSIS — R296 Repeated falls: Secondary | ICD-10-CM | POA: Diagnosis not present

## 2017-06-06 ENCOUNTER — Emergency Department (HOSPITAL_COMMUNITY): Payer: PPO

## 2017-06-06 ENCOUNTER — Encounter (HOSPITAL_COMMUNITY): Payer: Self-pay | Admitting: Emergency Medicine

## 2017-06-06 ENCOUNTER — Emergency Department (HOSPITAL_COMMUNITY)
Admission: EM | Admit: 2017-06-06 | Discharge: 2017-06-06 | Disposition: A | Discharge status: Home / Self Care | Payer: PPO | Attending: Emergency Medicine | Admitting: Emergency Medicine

## 2017-06-06 DIAGNOSIS — Y999 Unspecified external cause status: Secondary | ICD-10-CM | POA: Diagnosis not present

## 2017-06-06 DIAGNOSIS — Z79899 Other long term (current) drug therapy: Secondary | ICD-10-CM | POA: Diagnosis not present

## 2017-06-06 DIAGNOSIS — G20 Parkinson's disease: Secondary | ICD-10-CM | POA: Diagnosis not present

## 2017-06-06 DIAGNOSIS — S0240DA Maxillary fracture, left side, initial encounter for closed fracture: Secondary | ICD-10-CM | POA: Diagnosis not present

## 2017-06-06 DIAGNOSIS — S199XXA Unspecified injury of neck, initial encounter: Secondary | ICD-10-CM | POA: Diagnosis not present

## 2017-06-06 DIAGNOSIS — W01198A Fall on same level from slipping, tripping and stumbling with subsequent striking against other object, initial encounter: Secondary | ICD-10-CM | POA: Diagnosis not present

## 2017-06-06 DIAGNOSIS — Y92012 Bathroom of single-family (private) house as the place of occurrence of the external cause: Secondary | ICD-10-CM | POA: Diagnosis not present

## 2017-06-06 DIAGNOSIS — I10 Essential (primary) hypertension: Secondary | ICD-10-CM | POA: Diagnosis not present

## 2017-06-06 DIAGNOSIS — S0292XA Unspecified fracture of facial bones, initial encounter for closed fracture: Secondary | ICD-10-CM | POA: Diagnosis not present

## 2017-06-06 DIAGNOSIS — S0990XA Unspecified injury of head, initial encounter: Secondary | ICD-10-CM | POA: Diagnosis not present

## 2017-06-06 DIAGNOSIS — Y9389 Activity, other specified: Secondary | ICD-10-CM | POA: Diagnosis not present

## 2017-06-06 DIAGNOSIS — S0282XA Fracture of other specified skull and facial bones, left side, initial encounter for closed fracture: Secondary | ICD-10-CM | POA: Insufficient documentation

## 2017-06-06 MED ORDER — BACITRACIN ZINC 500 UNIT/GM EX OINT
TOPICAL_OINTMENT | CUTANEOUS | Status: AC
  Administered 2017-06-06: 1
  Filled 2017-06-06: qty 2.7

## 2017-06-06 MED ORDER — HYDROCODONE-ACETAMINOPHEN 5-325 MG PO TABS: 1.0000 | ORAL_TABLET | Freq: Four times a day (QID) | ORAL | 0 refills | Status: DC | PRN

## 2017-06-06 MED ORDER — HYDROCODONE-ACETAMINOPHEN 5-325 MG PO TABS
1.0000 | ORAL_TABLET | Freq: Once | ORAL | Status: AC
  Administered 2017-06-06: 1 via ORAL
  Filled 2017-06-06: qty 1

## 2017-06-06 NOTE — ED Provider Notes (Signed)
[facility] DEPT Provider Note   CSN: [id number] Arrival date & time: 06/06/17  0910     History   Chief Complaint Chief Complaint  Patient presents with  . Fall  . Facial Injury  . skin tear    HPI BEN HABERMANN is a 83 y.o. male.  HPI MUNIR VICTORIAN is a 83 y.o. male with hx of HTN, kidney stones, parkinsons disease, presents to ED with complaint of a fall. Pt states he was trying to turn in his bathroom and believes his feet got caught and he fell hitting his face on the floor. Pt with bruising and swelling to the left face. No dizziness or lightheadiness prior to the fall. No LOC. No CP or SOB. No other injuries. Ambulatory since the fall. No treatment prior to coming in.   Past Medical History:  Diagnosis Date  . Hypertension   . Kidney stones   . PD (Parkinson's disease) [facility])     Patient Active Problem List   Diagnosis Date Noted  . Parkinson's disease (tremor, stiffness, slow motion, unstable posture) ([place]) 03/14/2013    Past Surgical History:  Procedure Laterality Date  . ARM SURGERY Left    CANCER       Home Medications    Prior to Admission medications   Medication Sig Start Date End Date Taking? Authorizing Provider  carbidopa-levodopa (SINEMET IR) 25-100 MG tablet TAKE 1 TABLET BY MOUTH THREE TIMES A DAY 05/22/17  Yes Dohmeier, Asencion Partridge, MD  diphenhydrAMINE (BENADRYL) 12.5 MG chewable tablet Chew 12.5 mg by mouth 4 (four) times daily as needed for allergies.   Yes [provider]  polyethylene glycol powder (GLYCOLAX/MIRALAX) powder Take by mouth as needed for mild constipation.  08/01/13  Yes [provider]  rOPINIRole (REQUIP XL) 2 MG 24 hr tablet Take 1 tablet (2 mg total) by mouth at bedtime. Patient not taking: Reported on 06/06/2017 03/21/17   Dohmeier, Asencion Partridge, MD    Family History Family History  Problem Relation Age of Onset  . Alcoholism Daughter     Social History Social History    Tobacco Use  . Smoking status: Never Smoker  . Smokeless tobacco: Never Used  Substance Use Topics  . Alcohol use: No  . Drug use: No     Allergies   Flomax [tamsulosin hcl] and Morphine and related   Review of Systems Review of Systems  Constitutional: Negative for chills and fever.  HENT: Positive for facial swelling and nosebleeds.   Respiratory: Negative for cough, chest tightness and shortness of breath.   Cardiovascular: Negative for chest pain, palpitations and leg swelling.  Gastrointestinal: Negative for abdominal distention, abdominal pain, diarrhea, nausea and vomiting.  Musculoskeletal: Negative for arthralgias, myalgias, neck pain and neck stiffness.  Skin: Positive for color change and wound. Negative for rash.  Allergic/Immunologic: Negative for immunocompromised state.  Neurological: Positive for headaches. Negative for dizziness, weakness, light-headedness and numbness.  All other systems reviewed and are negative.    Physical Exam Updated Vital Signs BP (!) 144/71 (BP Location: Left Arm)   Pulse 65   Temp 97.7 F (36.5 C) (Oral)   Resp 18   SpO2 98%   Physical Exam  Constitutional: He appears well-developed and well-nourished. No distress.  HENT:  Head: Normocephalic and atraumatic.  Extensive bruising and swelling to the left face, specifically over left mandible, left maxilla, left periorbital area.  There is dried up blood in left nare.  No septal hematoma.  Tenderness  to the bridge of the nose.  No active bleeding.  There is some bruising to the left cheek, over oral mucosa. Small <1cm laceration to left maxilla. Pain with ROM of the mandible. Normal tooth alignment  Eyes: Conjunctivae and EOM are normal. Pupils are equal, round, and reactive to light.  Neck: Neck supple.  Cardiovascular: Normal rate, regular rhythm and normal heart sounds.  Pulmonary/Chest: Effort normal. No respiratory distress. He has no wheezes. He has no rales.   Musculoskeletal: He exhibits no edema.  No midline thoracic or lumbar spine tenderness. Full ROM of bilateral upper and lower extremities. Small skin tear to left dorsal hand. No bony tenderness. Full rom of all fingers and wrist.   Neurological: He is alert.  Skin: Skin is warm and dry.  Nursing note and vitals reviewed.    ED Treatments / Results  Labs (all labs ordered are listed, but only abnormal results are displayed) Labs Reviewed - No data to display  EKG  EKG Interpretation None       Radiology Ct Head Wo Contrast  Result Date: 06/06/2017 CLINICAL DATA:  Fall, bruising to left side of face. EXAM: CT HEAD WITHOUT CONTRAST CT MAXILLOFACIAL WITHOUT CONTRAST CT CERVICAL SPINE WITHOUT CONTRAST TECHNIQUE: Multidetector CT imaging of the head, cervical spine, and maxillofacial structures were performed using the standard protocol without intravenous contrast. Multiplanar CT image reconstructions of the cervical spine and maxillofacial structures were also generated. COMPARISON:  Head CT 05/30/2012 FINDINGS: CT HEAD FINDINGS Brain: There is atrophy and chronic small vessel disease changes. No acute intracranial abnormality. Specifically, no hemorrhage, hydrocephalus, mass lesion, acute infarction, or significant intracranial injury. Vascular: No hyperdense vessel or unexpected calcification. Skull: Fractures through the left orbit, visualized superior left maxillary sinus, and left zygomatic arch noted. See further discussion under facial CT. No calvarial fracture. Other: None CT MAXILLOFACIAL FINDINGS Osseous: Fracture noted through the left zygomatic arch, lateral wall of the left orbit and floor of the left orbit. Fractures through lateral wall of the left maxillary sinus. Fracture through the inferior anterior left maxillary sinus wall which extends inferiorly into the left maxilla between the left upper premolar and molar. Fracture through the medial wall of the left maxillary sinus  and into the hard palate. Fracture through the left lateral pterygoid plate. Orbits: Soft tissue swelling over the left orbit. Fractures of the left orbit as above. Globe is intact. Sinuses: Blood throughout the left maxillary sinus. Mucosal thickening in the ethmoid air cells and frontal sinuses. Soft tissues: Soft tissue swelling over the left orbit and face. There is soft tissue gas noted in the masticator space on the left. CT CERVICAL SPINE FINDINGS Alignment: Normal Skull base and vertebrae: No fracture Soft tissues and spinal canal: Prevertebral soft tissues are normal. No epidural or paraspinal hematoma. Disc levels:  Maintained Upper chest: Negative Other: Carotid artery calcifications bilaterally. IMPRESSION: No acute intracranial abnormality. Atrophy, chronic small vessel disease. Extensive left orbital and facial fractures including left tripod fracture. Fractures through all walls of the left maxillary sinus extend into the left maxilla between the left upper premolar and adjacent molar and into the left side of the hard palate. Fracture through the lateral left pterygoid plate. No acute bony abnormality in the cervical spine. Electronically Signed   By: Rolm Baptise M.D.   On: 06/06/2017 12:07   Ct Cervical Spine Wo Contrast  Result Date: 06/06/2017 CLINICAL DATA:  Fall, bruising to left side of face. EXAM: CT HEAD WITHOUT CONTRAST CT MAXILLOFACIAL  WITHOUT CONTRAST CT CERVICAL SPINE WITHOUT CONTRAST TECHNIQUE: Multidetector CT imaging of the head, cervical spine, and maxillofacial structures were performed using the standard protocol without intravenous contrast. Multiplanar CT image reconstructions of the cervical spine and maxillofacial structures were also generated. COMPARISON:  Head CT 05/30/2012 FINDINGS: CT HEAD FINDINGS Brain: There is atrophy and chronic small vessel disease changes. No acute intracranial abnormality. Specifically, no hemorrhage, hydrocephalus, mass lesion, acute  infarction, or significant intracranial injury. Vascular: No hyperdense vessel or unexpected calcification. Skull: Fractures through the left orbit, visualized superior left maxillary sinus, and left zygomatic arch noted. See further discussion under facial CT. No calvarial fracture. Other: None CT MAXILLOFACIAL FINDINGS Osseous: Fracture noted through the left zygomatic arch, lateral wall of the left orbit and floor of the left orbit. Fractures through lateral wall of the left maxillary sinus. Fracture through the inferior anterior left maxillary sinus wall which extends inferiorly into the left maxilla between the left upper premolar and molar. Fracture through the medial wall of the left maxillary sinus and into the hard palate. Fracture through the left lateral pterygoid plate. Orbits: Soft tissue swelling over the left orbit. Fractures of the left orbit as above. Globe is intact. Sinuses: Blood throughout the left maxillary sinus. Mucosal thickening in the ethmoid air cells and frontal sinuses. Soft tissues: Soft tissue swelling over the left orbit and face. There is soft tissue gas noted in the masticator space on the left. CT CERVICAL SPINE FINDINGS Alignment: Normal Skull base and vertebrae: No fracture Soft tissues and spinal canal: Prevertebral soft tissues are normal. No epidural or paraspinal hematoma. Disc levels:  Maintained Upper chest: Negative Other: Carotid artery calcifications bilaterally. IMPRESSION: No acute intracranial abnormality. Atrophy, chronic small vessel disease. Extensive left orbital and facial fractures including left tripod fracture. Fractures through all walls of the left maxillary sinus extend into the left maxilla between the left upper premolar and adjacent molar and into the left side of the hard palate. Fracture through the lateral left pterygoid plate. No acute bony abnormality in the cervical spine. Electronically Signed   By: Rolm Baptise M.D.   On: 06/06/2017 12:07   Ct  Maxillofacial Wo Contrast  Result Date: 06/06/2017 CLINICAL DATA:  Fall, bruising to left side of face. EXAM: CT HEAD WITHOUT CONTRAST CT MAXILLOFACIAL WITHOUT CONTRAST CT CERVICAL SPINE WITHOUT CONTRAST TECHNIQUE: Multidetector CT imaging of the head, cervical spine, and maxillofacial structures were performed using the standard protocol without intravenous contrast. Multiplanar CT image reconstructions of the cervical spine and maxillofacial structures were also generated. COMPARISON:  Head CT 05/30/2012 FINDINGS: CT HEAD FINDINGS Brain: There is atrophy and chronic small vessel disease changes. No acute intracranial abnormality. Specifically, no hemorrhage, hydrocephalus, mass lesion, acute infarction, or significant intracranial injury. Vascular: No hyperdense vessel or unexpected calcification. Skull: Fractures through the left orbit, visualized superior left maxillary sinus, and left zygomatic arch noted. See further discussion under facial CT. No calvarial fracture. Other: None CT MAXILLOFACIAL FINDINGS Osseous: Fracture noted through the left zygomatic arch, lateral wall of the left orbit and floor of the left orbit. Fractures through lateral wall of the left maxillary sinus. Fracture through the inferior anterior left maxillary sinus wall which extends inferiorly into the left maxilla between the left upper premolar and molar. Fracture through the medial wall of the left maxillary sinus and into the hard palate. Fracture through the left lateral pterygoid plate. Orbits: Soft tissue swelling over the left orbit. Fractures of the left orbit as above. Globe is  intact. Sinuses: Blood throughout the left maxillary sinus. Mucosal thickening in the ethmoid air cells and frontal sinuses. Soft tissues: Soft tissue swelling over the left orbit and face. There is soft tissue gas noted in the masticator space on the left. CT CERVICAL SPINE FINDINGS Alignment: Normal Skull base and vertebrae: No fracture Soft tissues  and spinal canal: Prevertebral soft tissues are normal. No epidural or paraspinal hematoma. Disc levels:  Maintained Upper chest: Negative Other: Carotid artery calcifications bilaterally. IMPRESSION: No acute intracranial abnormality. Atrophy, chronic small vessel disease. Extensive left orbital and facial fractures including left tripod fracture. Fractures through all walls of the left maxillary sinus extend into the left maxilla between the left upper premolar and adjacent molar and into the left side of the hard palate. Fracture through the lateral left pterygoid plate. No acute bony abnormality in the cervical spine. Electronically Signed   By: Rolm Baptise M.D.   On: 06/06/2017 12:07    Procedures Procedures (including critical care time)  Medications Ordered in ED Medications - No data to display   Initial Impression / Assessment and Plan / ED Course  I have reviewed the triage vital signs and the nursing notes.  Pertinent labs & imaging results that were available during my care of the patient were reviewed by me and considered in my medical decision making (see chart for details).     Pt with fall due to his parkinsons.  Complaining of an injury to the left face.  No other injuries.  Ambulatory.  Denies dizziness or lightheadedness.  Will get CT head, maxillofacial, cervical spine.  1:32 PM Patient with extensive left facial fractures.  Extraocular movement intact.  Patient is alert and oriented x4, with no symptoms other than pain to the face.  Discussed patient CT scan with ear nose throat doctor, Dr. Blenda Nicely, who will see patient in office tomorrow.  She advised to control patient's pain, soft diet, no nose blowing, continue ice packs to the face.  Discussed the plan with patient and his family who agreed to the plan.  Vitals:   06/06/17 0928 06/06/17 1231  BP: (!) 144/71 (!) 184/104  Pulse: 65 (!) 59  Resp: 18 15  Temp: 97.7 F (36.5 C)   TempSrc: Oral   SpO2: 98% 100%          Final Clinical Impressions(s) / ED Diagnoses   Final diagnoses:  Closed extensive facial fractures, initial encounter [facility])    ED Discharge Orders        Ordered    HYDROcodone-acetaminophen (NORCO) 5-325 MG tablet  Every 6 hours PRN     06/06/17 1334       Jeannett Senior, PA-C 06/06/17 1335    Dorie Rank, MD 06/07/17 1402

## 2017-06-06 NOTE — Discharge Instructions (Signed)
Apply ice pack to the face several times a day.  Take Tylenol for pain.  Norco for severe pain only.  Soft diet.  No blowing your nose.  Follow-up with Dr. Donnamarie Poag tomorrow.

## 2017-06-06 NOTE — ED Triage Notes (Signed)
Patient reports was trying to get to bathroom when feet got ahead of himself causing him to fall. Patient has bruising and swelling to left side of face and skin tear to left hand.  Patient denies any LOC or taking blood thinners.

## 2017-06-07 DIAGNOSIS — S0240FA Zygomatic fracture, left side, initial encounter for closed fracture: Secondary | ICD-10-CM | POA: Diagnosis not present

## 2017-06-07 DIAGNOSIS — S02401A Maxillary fracture, unspecified, initial encounter for closed fracture: Secondary | ICD-10-CM | POA: Insufficient documentation

## 2017-06-07 DIAGNOSIS — S0232XA Fracture of orbital floor, left side, initial encounter for closed fracture: Secondary | ICD-10-CM | POA: Diagnosis not present

## 2017-06-08 ENCOUNTER — Telehealth: Payer: Self-pay | Admitting: Adult Health

## 2017-06-08 DIAGNOSIS — R296 Repeated falls: Secondary | ICD-10-CM | POA: Diagnosis not present

## 2017-06-08 DIAGNOSIS — R2689 Other abnormalities of gait and mobility: Secondary | ICD-10-CM | POA: Diagnosis not present

## 2017-06-08 NOTE — Telephone Encounter (Signed)
Called the patient to get more information. The patient was seen in sept with Dr Brett Fairy and at that time we started Sinemet IR. About a month later we stopped the requip because wasn't tolerating both together. In the last month the patient is falling very frequently and has hit his head twice in the last month requiring a ED visit on 12/11 which he received 13 staples. The patient also had facial fx and is having to see ENT for that. The son is asking if we think there needs to be changes to his medications that may help and if so would we like to start that before his upcoming apt in jan.

## 2017-06-08 NOTE — Telephone Encounter (Signed)
Pt's son called he is falling at least once a day for the past month. Pt has an appt on 06/28/17. He would like to speak with NP prior to appt reg any medication changes that might need to be made presently. Pt's son is aware NP is out of the office until tomorrow and this can wait.

## 2017-06-09 NOTE — Telephone Encounter (Signed)
I called the patient's Son. He reports that he has been doing well on Sinemet however in the last month he has began to have more falls.  He states that his father sometimes misses a dose.  He feels that this may be related to his falls.  I advised that first, we should try to make sure the patient is taking his medication consistently.  If he continues to have falls despite taking his medication they should let us know.  Also advised that they should monitor his symptoms.  If his symptoms tend to worsen before his next dose of Sinemet we may need to increase the dosing. His son voiced understanding.  He states that they currently have someone staying with his dad 24 hours a day.  The patient is still getting physical therapy in his home.  If he continues to have falls they will let us know.  Of course for any concerning or emergent needs they will go to the emergency room.  For now we will keep the appointment in January.

## 2017-06-13 DIAGNOSIS — R296 Repeated falls: Secondary | ICD-10-CM | POA: Diagnosis not present

## 2017-06-13 DIAGNOSIS — R2689 Other abnormalities of gait and mobility: Secondary | ICD-10-CM | POA: Diagnosis not present

## 2017-06-13 NOTE — Telephone Encounter (Signed)
Pt's son called they have noticed he is unable around the middle of night when getting up to go the bathroom. He is wanting to discuss going to an extended release. Please call to discuss

## 2017-06-14 MED ORDER — CARBIDOPA-LEVODOPA ER 25-100 MG PO TBCR: 1.0000 | EXTENDED_RELEASE_TABLET | Freq: Every day | ORAL | 5 refills | Status: DC

## 2017-06-14 NOTE — Telephone Encounter (Signed)
I called the patient Austin Juarez.  He reports that the Sinemet dose during the day has helped with his dizziness as well as his ambulation.  He feels that if he had the extended release tablet at night it may also help him during the night when he gets up.  He states currently he experiences more dizziness when he gets up during the night and they have noticed that his gait is unsteady.  I advised that we can order the 25-100 mg to be taken at bedtime.  He will continue taking Sinemet 25-100 mg immediate release 3 times a day.  He will take his first dose to 8 AM, 12 PM and 4 PM.  He will take the extended release dose before bedtime.  The son voiced understanding.  He has an appointment January 2 with our office.

## 2017-06-14 NOTE — Addendum Note (Signed)
Addended by: Trudie Buckler on: 06/14/2017 05:25 PM   Modules accepted: Orders

## 2017-06-15 DIAGNOSIS — R296 Repeated falls: Secondary | ICD-10-CM | POA: Diagnosis not present

## 2017-06-15 DIAGNOSIS — R2689 Other abnormalities of gait and mobility: Secondary | ICD-10-CM | POA: Diagnosis not present

## 2017-06-19 DIAGNOSIS — R2689 Other abnormalities of gait and mobility: Secondary | ICD-10-CM | POA: Diagnosis not present

## 2017-06-19 DIAGNOSIS — R296 Repeated falls: Secondary | ICD-10-CM | POA: Diagnosis not present

## 2017-06-21 ENCOUNTER — Telehealth: Payer: Self-pay | Admitting: Neurology

## 2017-06-21 DIAGNOSIS — R296 Repeated falls: Secondary | ICD-10-CM | POA: Diagnosis not present

## 2017-06-21 DIAGNOSIS — R2689 Other abnormalities of gait and mobility: Secondary | ICD-10-CM | POA: Diagnosis not present

## 2017-06-21 NOTE — Telephone Encounter (Signed)
BP should not be lower than 572 systolic to perform PT/ gait exercises. He is at high fall risk anyway with PD, may need to have more assistance. Sinemet can cause lower BP.  Please make sure patient is well hydrated and has taken his medication more than 1 hour prior to a meal and to therapy. CD

## 2017-06-21 NOTE — Telephone Encounter (Signed)
Austin Juarez, PT coordinator is calling on behalf of the patient. The patient is continuing to have mult falls. He was noted having 2 more over the weekend and recently in previous notes shown to have a couple others. There were recent medication adjustments made to the Sinemet including starting the pt on a extended release. The therapist was working with his today and noted that his pressure dropped to 85/41 and he was having dizziness. She is worried that this may be happening frequently and could be causing the falls.  She is calling to get some parameters to go by with working the patient on what the MD feels is safe. I informed her that I would make Dr Dohmeier aware and see what recommendations she has. The patient is only doing therapy 2 days a wk. He does have someone with him 24/7 since the fall earlier in december

## 2017-06-21 NOTE — Telephone Encounter (Signed)
Called Adela Glimpse to inform her of what Dr Dohmeier recommended. LVM for her to call us back and discuss further. If she calls back please inform her what Dr Brett Fairy recommended or have me speak with her. Thank You

## 2017-06-21 NOTE — Telephone Encounter (Addendum)
Juliann Pulse returned RN's call. Message relayed, she agrees with the parameters. She did not have any questions.  Thank you

## 2017-06-23 DIAGNOSIS — R2689 Other abnormalities of gait and mobility: Secondary | ICD-10-CM | POA: Diagnosis not present

## 2017-06-23 DIAGNOSIS — R296 Repeated falls: Secondary | ICD-10-CM | POA: Diagnosis not present

## 2017-06-27 NOTE — Progress Notes (Signed)
PATIENT: Austin Juarez DOB: 12-17-1929  REASON FOR VISIT: follow up HISTORY FROM: patient  HISTORY OF PRESENT ILLNESS: Today 06/28/17 Austin Juarez is an 84 year old male with Parkinson disease.  He returns today for follow-up.  His medication was adjusted June 08, 2017.  He is currently taking Sinemet 25-100 mg 3 times a day and Sinemet extended release 25-100 mg at bedtime.  The patient's physical therapist called in December 26 and reported that he was continued to have multiple falls and she had noted low blood pressure.  At that time Dr. Brett Fairy recommended that therapy should only take place if his systolic blood pressure is greater than 110.  She also advised that the patient should be will hydrated and his medication should be taken 1 hour prior to a meal into therapy.  The patient's family states that he continues to have orthostatic hypotension according to the physical therapist.  They report typically when he stands his systolic blood pressure may drop to the 60s.  They do report that they feel that the blood pressure got worse when we added on Sinemet extended release dose at bedtime.  They feel that the patient has most problems in the morning.  They state that they typically give the morning dose of Sinemet as soon as he gets up and approximately 30 minutes later he will have his breakfast.  They report that his gait is more unsteady and he had more dizziness in the morning.  They report approximately 1 hour after his Sinemet dose his symptoms do improve.  The patient does have 24-hour care now.  He continues to have some falls.  Physical therapy works with him weekly.  Family reports they have successfully increase his water intake slightly.  They report that he was drinking approximately 1 bottle of water daily and they have got him at least drinking 2 bottles of water daily now.  The family does note that when he is well-hydrated his symptoms do seem to improve.  They return today  for an evaluation.   HISTORY 03-21-2017 ,[name] is a 84 year old Caucasian right-handed gentleman with a history of Parkinson's disease. He was recently tried on Requip 3 times a day but this caused micro-sleep attacks, he also had progressive falls more and more frequent. Physical therapy is now involved in at home care he did not meet occupational therapy services but was evaluated for such. He reports that his memory appears not as good and that he sometimes forgets to take his medication so one of our concerns is to start him on a regimen that allows extended release forms of medication. He demonstrated more problems with ambulation, has very small steps a shuffling g difficulties to raise from a seated position, difficulties to turn. Sitting back on the chair was also very.difficult for him. All his falls were retropulsive.    Austin Juarez is an 84 year old male with a history of Parkinson's disease. He returns today for follow-up. He is currently taking Requip 2 times a day. This has been prescribed 3 times a day however he reports that he is only been doing it twice a day. He reports that he is having more difficulty with ambulation and falls. He states that he has approximately 6 falls since the last visit. Fortunately has not suffered any injuries. He feels that the tremor has remained stable. Reports he has a tremor in both hands does not feel that one side is worse than other. He reports some difficulty  swallowing but denies choking on food or liquids. He reports that he is sleeping okay. His wife has dementia so he tries to assist her. He is not using an assistive device when ambulating. He returns today for an evaluation.  HISTORY 03/16/15:  Austin Juarez is a 84 year old male with a history of Parkinson's disease. He returns today for follow-up. The patient is currently taking Requip 3 times a day. He feels that this medication has been beneficial. He denies any significant changes  since the last visit. Denies any changes with his gait or balance. He does state that he has learned not to make turns very quickly. He does not use a walker or cane when ambulating. Denies any falls. He states that his tremor is primarily located in the left hand. He denies any changes with his swallowing. He states that if he takes a big bite of food this sometimes causes him some issues. Denies any changes with his sleep. He does state that he gets up typically 3 times a night to urinate. Overall he feels that things have maintained. He returns today for an evaluation.  HISTORY  09/17/14 ([place]): Austin Juarez is a 84 y.o. male Is seen here as a referral/ revisit from [place] , for Tremor and muscle rigor, thought to reflect Parkinson's disease. The patient has been last seen in this office by NP. The patient continues to have a mild jaw tremor, a right-hand dominant tremor or is not seen today at rest. He does have several dermatitis and a masked face. He continues to play golf and is encouraged to continue physical activity. He is taking care of his granddaughter, who lives with them. During my last visit with the patient on 03-29-2012 the patient did better on Mirapex but due to the expense correlated with his medication stayed on twice a day dosing and did not use it 3 times a day. We waited for January of this year when his prescription but unchanged. Laps and EEGs in the last visit were normal an imaging study was scheduled. He remains independent in all activities of daily living. He had noticed some bruising in the past from daily baby aspirin him and has for that reason stopped the aspirin. However, I would like for him to try to take the aspirin every other day to have some of the vascular protective benefits of the medication.  09-17-14 CD Visible resting tremor, right hand dominant with a slight jaw tremor. The patient has some dysphonia but reports no dysphagia. Today's [place]  cognitive assessment test scored at 24 out of 30 points in the last visit he scored 26 out of 30 points. I think he lost his points because his fine motor skills were worse. He is fully oriented but he lost 3 of the 5 recall words. One point and obstruction, and one point in word fluency. He did the serial 7 the attention to words and the digit listing. He is feeling he gets worse, He lives no alone with his wife, his granddaughter "Roselyn Reef "who used to reside with him has moved to [place]. His grandchildren are orphans and have alcohol embryopathy.   REVIEW OF SYSTEMS: Out of a complete 14 system review of symptoms, the patient complains only of the following symptoms, and all other reviewed systems are negative.  ALLERGIES: Allergies  Allergen Reactions  . Flomax [Tamsulosin Hcl] Nausea Only    Violently   . Morphine And Related     nausea  HOME MEDICATIONS: Outpatient Medications Prior to Visit  Medication Sig Dispense Refill  . carbidopa-levodopa (SINEMET IR) 25-100 MG tablet TAKE 1 TABLET BY MOUTH THREE TIMES A DAY 90 tablet 5  . Carbidopa-Levodopa ER (SINEMET CR) 25-100 MG tablet controlled release Take 1 tablet by mouth at bedtime. 30 tablet 5  . diphenhydrAMINE (BENADRYL) 12.5 MG chewable tablet Chew 12.5 mg by mouth 4 (four) times daily as needed for allergies.    Marland Kitchen HYDROcodone-acetaminophen (NORCO) 5-325 MG tablet Take 1 tablet by mouth every 6 (six) hours as needed for moderate pain. 10 tablet 0  . polyethylene glycol powder (GLYCOLAX/MIRALAX) powder Take by mouth as needed for mild constipation.     Marland Kitchen rOPINIRole (REQUIP XL) 2 MG 24 hr tablet Take 1 tablet (2 mg total) by mouth at bedtime. (Patient not taking: Reported on 06/06/2017) 30 tablet 5   No facility-administered medications prior to visit.     PAST MEDICAL HISTORY: Past Medical History:  Diagnosis Date  . Hypertension   . Kidney stones   . PD (Parkinson's disease) ([place])     PAST SURGICAL HISTORY: Past  Surgical History:  Procedure Laterality Date  . ARM SURGERY Left    CANCER    FAMILY HISTORY: Family History  Problem Relation Age of Onset  . Alcoholism Daughter     SOCIAL HISTORY: Social History   Socioeconomic History  . Marital status: Married    Spouse name: [name]  . Number of children: 2  . Years of education: 1  . Highest education level: Not on file  Social Needs  . Financial resource strain: Not on file  . Food insecurity - worry: Not on file  . Food insecurity - inability: Not on file  . Transportation needs - medical: Not on file  . Transportation needs - non-medical: Not on file  Occupational History  . Occupation: retired  Tobacco Use  . Smoking status: Never Smoker  . Smokeless tobacco: Never Used  Substance and Sexual Activity  . Alcohol use: No  . Drug use: No  . Sexual activity: Not on file  Other Topics Concern  . Not on file  Social History Narrative   Patient lives at home with wife [name].   Patient has 3 children. 1 deceased.    Patient is retired.    Patient is right handed.    Patient has a college degree.    Caffeine 2 cups coffee daily.          PHYSICAL EXAM  Vitals:   06/28/17 0929  BP: (!) 96/46  Pulse: (!) 53  Weight: 141 lb (64 kg)   Body mass index is 22.08 kg/m.  Orthostatic VS for the past 24 hrs:  BP- Lying Pulse- Lying BP- Sitting Pulse- Sitting BP- Standing at 0 minutes Pulse- Standing at 0 minutes  06/28/17 1016 108/60 54 115/57 55 93/48 55    MMSE - Mini Mental State Exam 06/28/2017 03/21/2017  Orientation to time 0 5  Orientation to Place 1 5  Registration 3 3  Attention/ Calculation 1 5  Recall 1 2  Language- name 2 objects 2 2  Language- repeat 1 0  Language- follow 3 step command 3 3  Language- read & follow direction 1 1  Write a sentence 1 1  Copy design 0 1  Total score 14 28      Generalized: Well developed, in no acute distress   Neurological examination  Mentation: Alert oriented to  time, place, history taking. Follows all  commands speech and language fluent Cranial nerve II-XII: Pupils were equal round reactive to light. Extraocular movements were full, visual field were full on confrontational test. Facial sensation and strength were normal. Uvula tongue midline. Head turning and shoulder shrug  were normal and symmetric. Motor: The motor testing reveals 5 over 5 strength of all 4 extremities. Good symmetric motor tone is noted throughout.  Sensory: Sensory testing is intact to soft touch on all 4 extremities. No evidence of extinction is noted.  Coordination: Cerebellar testing reveals good finger-nose-finger and heel-to-shin bilaterally.  Gait and station: Gait is normal. Tandem gait is normal. Romberg is negative. No drift is seen.  Reflexes: Deep tendon reflexes are symmetric and normal bilaterally.   DIAGNOSTIC DATA (LABS, IMAGING, TESTING) - I reviewed patient records, labs, notes, testing and imaging myself where available.  Lab Results  Component Value Date   WBC 5.7 08/20/2015   HGB 13.1 08/20/2015   HCT 37.8 (L) 08/20/2015   MCV 99.2 08/20/2015   PLT 127 (L) 08/20/2015      Component Value Date/Time   NA 143 08/20/2015 1421   K 4.3 08/20/2015 1421   CL 107 08/20/2015 1421   CO2 25 08/20/2015 1421   GLUCOSE 115 (H) 08/20/2015 1421   BUN 32 (H) 08/20/2015 1421   CREATININE 1.50 (H) 08/20/2015 1421   CALCIUM 9.4 08/20/2015 1421   GFRNONAA 41 (L) 08/20/2015 1421   GFRAA 48 (L) 08/20/2015 1421   No results found for: CHOL, HDL, LDLCALC, LDLDIRECT, TRIG, CHOLHDL No results found for: HGBA1C No results found for: VITAMINB12 No results found for: TSH    ASSESSMENT AND PLAN 84 y.o. year old male  has a past medical history of Hypertension, Kidney stones, and PD (Parkinson's disease) ([place]). here with:  1.  Parkinson's disease 2.  Abnormality of gait 3.  Frequent falls 4.  Memory disturbance   The patient is having issues with orthostatic  hypotension since we added on Sinemet extended release at bedtime.  Dizziness and gait difficulty seems to be worse in the morning.  I advised that they should discontinue Sinemet extended release tablet at bedtime.  I have advised the patient and his family that they could potentially try to set him on the side of the bed in the morning and moving him to a nearby chair to sit upright for a while before he is expected to ambulate.  They can also give him his first dose of Sinemet at this time.  I did discuss that we could potentially increase his morning dose of Sinemet to 1-1/2 tablets.  Reports that they will first try to get him upright to see if this offers him any benefit.  I did advise that if his blood pressure does not improve once they discontinue the Sinemet extended release they should let us know.  The patient's memory score has also decreased since last visit.  I will check blood work and a urinalysis today.  Advised that if his symptoms worsen or he develops new symptoms they should let us know.  He will follow-up in 6 months or sooner if needed.  I spent approximately 1 hour with the patient and his family discussing symptoms, medication and therapy.   Ward Givens, MSN, NP-C 06/27/2017, 4:07 PM [facility] [address] [place],  [address] [phone]

## 2017-06-28 ENCOUNTER — Ambulatory Visit (INDEPENDENT_AMBULATORY_CARE_PROVIDER_SITE_OTHER): Payer: PPO | Admitting: Adult Health

## 2017-06-28 ENCOUNTER — Encounter: Payer: Self-pay | Admitting: Adult Health

## 2017-06-28 VITALS — BP 96/46 | HR 53 | Wt 141.0 lb

## 2017-06-28 DIAGNOSIS — R413 Other amnesia: Secondary | ICD-10-CM | POA: Diagnosis not present

## 2017-06-28 DIAGNOSIS — R296 Repeated falls: Secondary | ICD-10-CM | POA: Diagnosis not present

## 2017-06-28 DIAGNOSIS — G20 Parkinson's disease: Secondary | ICD-10-CM | POA: Diagnosis not present

## 2017-06-28 DIAGNOSIS — R2689 Other abnormalities of gait and mobility: Secondary | ICD-10-CM | POA: Diagnosis not present

## 2017-06-28 NOTE — Patient Instructions (Addendum)
Your Plan:  Continue sinemet 3 times a day. We could potentially try 1.5 tablets in the morning.  Stop Sinement extended release tablet If blood pressure does not improve let us know If your symptoms worsen or you develop new symptoms please let us know.   Thank you for coming to see [place] at [facility]. I hope we have been able to provide you high quality care today.  You may receive a patient satisfaction survey over the next few weeks. We would appreciate your feedback and comments so that we may continue to improve ourselves and the health of our patients.

## 2017-06-29 DIAGNOSIS — R2689 Other abnormalities of gait and mobility: Secondary | ICD-10-CM | POA: Diagnosis not present

## 2017-06-29 DIAGNOSIS — R296 Repeated falls: Secondary | ICD-10-CM | POA: Diagnosis not present

## 2017-06-29 LAB — CBC WITH DIFFERENTIAL/PLATELET
BASOS: 0 %
Basophils Absolute: 0 10*3/uL (ref 0.0–0.2)
EOS (ABSOLUTE): 0.2 10*3/uL (ref 0.0–0.4)
EOS: 2 %
HEMATOCRIT: 33.1 % — AB (ref 37.5–51.0)
Hemoglobin: 11.4 g/dL — ABNORMAL LOW (ref 13.0–17.7)
IMMATURE GRANS (ABS): 0 10*3/uL (ref 0.0–0.1)
IMMATURE GRANULOCYTES: 0 %
LYMPHS: 24 %
Lymphocytes Absolute: 1.7 10*3/uL (ref 0.7–3.1)
MCH: 33.4 pg — ABNORMAL HIGH (ref 26.6–33.0)
MCHC: 34.4 g/dL (ref 31.5–35.7)
MCV: 97 fL (ref 79–97)
MONOCYTES: 9 %
MONOS ABS: 0.7 10*3/uL (ref 0.1–0.9)
NEUTROS PCT: 65 %
Neutrophils Absolute: 4.5 10*3/uL (ref 1.4–7.0)
Platelets: 178 10*3/uL (ref 150–379)
RBC: 3.41 x10E6/uL — ABNORMAL LOW (ref 4.14–5.80)
RDW: 13.5 % (ref 12.3–15.4)
WBC: 7.1 10*3/uL (ref 3.4–10.8)

## 2017-06-29 LAB — COMPREHENSIVE METABOLIC PANEL
ALK PHOS: 84 IU/L (ref 39–117)
ALT: 8 IU/L (ref 0–44)
AST: 16 IU/L (ref 0–40)
Albumin/Globulin Ratio: 1.9 (ref 1.2–2.2)
Albumin: 4 g/dL (ref 3.5–4.7)
BUN/Creatinine Ratio: 30 — ABNORMAL HIGH (ref 10–24)
BUN: 38 mg/dL — AB (ref 8–27)
Bilirubin Total: 0.5 mg/dL (ref 0.0–1.2)
CO2: 22 mmol/L (ref 20–29)
CREATININE: 1.27 mg/dL (ref 0.76–1.27)
Calcium: 9 mg/dL (ref 8.6–10.2)
Chloride: 107 mmol/L — ABNORMAL HIGH (ref 96–106)
GFR calc Af Amer: 59 mL/min/{1.73_m2} — ABNORMAL LOW (ref >59)
GFR, EST NON AFRICAN AMERICAN: 51 mL/min/{1.73_m2} — AB (ref >59)
GLUCOSE: 93 mg/dL (ref 65–99)
Globulin, Total: 2.1 g/dL (ref 1.5–4.5)
Potassium: 4.8 mmol/L (ref 3.5–5.2)
SODIUM: 143 mmol/L (ref 134–144)
Total Protein: 6.1 g/dL (ref 6.0–8.5)

## 2017-06-29 LAB — URINALYSIS, ROUTINE W REFLEX MICROSCOPIC
BILIRUBIN UA: NEGATIVE
GLUCOSE, UA: NEGATIVE
Nitrite, UA: POSITIVE — AB
PH UA: 5.5 (ref 5.0–7.5)
PROTEIN UA: NEGATIVE
RBC UA: NEGATIVE
Specific Gravity, UA: 1.021 (ref 1.005–1.030)
UUROB: 1 mg/dL (ref 0.2–1.0)

## 2017-06-29 LAB — MICROSCOPIC EXAMINATION: RBC MICROSCOPIC, UA: NONE SEEN /HPF (ref 0–?)

## 2017-06-29 NOTE — Progress Notes (Signed)
I have reviewed and agreed above plan. 

## 2017-06-30 ENCOUNTER — Telehealth: Payer: Self-pay

## 2017-06-30 DIAGNOSIS — R296 Repeated falls: Secondary | ICD-10-CM | POA: Diagnosis not present

## 2017-06-30 DIAGNOSIS — R2689 Other abnormalities of gait and mobility: Secondary | ICD-10-CM | POA: Diagnosis not present

## 2017-06-30 NOTE — Telephone Encounter (Signed)
-----   Message from Ward Givens, NP sent at 06/30/2017  8:49 AM EST ----- Kidney function consist with previous blood work. RBC, HGB and HCT is low indicating anemia. Advise family that we will send to PCP for further eval. Also UA showed trace leukocytes and nitrates which could be indicative of infection but since its trace I would like opinion of PCP as to whether to treat with ABX. Please send all blood work to PCP. Advise family that they should let PCP know that we are sending over blood work as he may need an appointment with PCP.

## 2017-06-30 NOTE — Telephone Encounter (Signed)
I spoke with patient's son Austin Juarez, ok per DPR. I reviewed all of the results and recommendations and advised him to contact the PCP office. He voiced understanding and agreed. I confirmed PCP and manually faxed results to him and received confirmation page. I advised Austin Juarez to call us with any questions or concerns.

## 2017-07-03 DIAGNOSIS — R2689 Other abnormalities of gait and mobility: Secondary | ICD-10-CM | POA: Diagnosis not present

## 2017-07-03 DIAGNOSIS — R296 Repeated falls: Secondary | ICD-10-CM | POA: Diagnosis not present

## 2017-07-04 DIAGNOSIS — R2689 Other abnormalities of gait and mobility: Secondary | ICD-10-CM | POA: Diagnosis not present

## 2017-07-04 DIAGNOSIS — R296 Repeated falls: Secondary | ICD-10-CM | POA: Diagnosis not present

## 2017-07-05 DIAGNOSIS — S02401D Maxillary fracture, unspecified, subsequent encounter for fracture with routine healing: Secondary | ICD-10-CM | POA: Diagnosis not present

## 2017-07-05 DIAGNOSIS — S0240FD Zygomatic fracture, left side, subsequent encounter for fracture with routine healing: Secondary | ICD-10-CM | POA: Diagnosis not present

## 2017-07-05 DIAGNOSIS — S0232XD Fracture of orbital floor, left side, subsequent encounter for fracture with routine healing: Secondary | ICD-10-CM | POA: Diagnosis not present

## 2017-07-06 DIAGNOSIS — R2689 Other abnormalities of gait and mobility: Secondary | ICD-10-CM | POA: Diagnosis not present

## 2017-07-06 DIAGNOSIS — R296 Repeated falls: Secondary | ICD-10-CM | POA: Diagnosis not present

## 2017-07-10 DIAGNOSIS — R2689 Other abnormalities of gait and mobility: Secondary | ICD-10-CM | POA: Diagnosis not present

## 2017-07-10 DIAGNOSIS — R296 Repeated falls: Secondary | ICD-10-CM | POA: Diagnosis not present

## 2017-07-11 DIAGNOSIS — R296 Repeated falls: Secondary | ICD-10-CM | POA: Diagnosis not present

## 2017-07-11 DIAGNOSIS — R2689 Other abnormalities of gait and mobility: Secondary | ICD-10-CM | POA: Diagnosis not present

## 2017-07-12 ENCOUNTER — Ambulatory Visit: Payer: PPO | Admitting: Neurology

## 2017-07-13 DIAGNOSIS — R296 Repeated falls: Secondary | ICD-10-CM | POA: Diagnosis not present

## 2017-07-13 DIAGNOSIS — R2689 Other abnormalities of gait and mobility: Secondary | ICD-10-CM | POA: Diagnosis not present

## 2017-07-14 DIAGNOSIS — R2689 Other abnormalities of gait and mobility: Secondary | ICD-10-CM | POA: Diagnosis not present

## 2017-07-14 DIAGNOSIS — R296 Repeated falls: Secondary | ICD-10-CM | POA: Diagnosis not present

## 2017-07-18 DIAGNOSIS — R2689 Other abnormalities of gait and mobility: Secondary | ICD-10-CM | POA: Diagnosis not present

## 2017-07-18 DIAGNOSIS — R296 Repeated falls: Secondary | ICD-10-CM | POA: Diagnosis not present

## 2017-07-27 ENCOUNTER — Ambulatory Visit: Payer: PPO | Admitting: Adult Health

## 2017-08-04 ENCOUNTER — Telehealth: Payer: Self-pay | Admitting: Adult Health

## 2017-08-04 DIAGNOSIS — G20 Parkinson's disease: Secondary | ICD-10-CM

## 2017-08-04 DIAGNOSIS — N183 Chronic kidney disease, stage 3 (moderate): Secondary | ICD-10-CM | POA: Diagnosis not present

## 2017-08-04 DIAGNOSIS — I959 Hypotension, unspecified: Secondary | ICD-10-CM | POA: Diagnosis not present

## 2017-08-04 DIAGNOSIS — R609 Edema, unspecified: Secondary | ICD-10-CM | POA: Diagnosis not present

## 2017-08-04 NOTE — Telephone Encounter (Signed)
Pt son Richardson Landry calling stating that pt is no longer getting physical therapy but everyone can tell the difference. Richardson Landry is wanting to know if we can extend pts PT

## 2017-08-07 NOTE — Telephone Encounter (Signed)
LVM for son, Richardson Landry, on [place] and requested a call back to discuss.

## 2017-08-07 NOTE — Telephone Encounter (Signed)
Can you get more information as to why PT has been stopped.  Was patient not meeting goals?  Or would insurance no longer cover this?

## 2017-08-08 NOTE — Telephone Encounter (Signed)
Spoke to son, Richardson Landry.  Pt doing better with PT when done by [facility] in house [facility].  He states insurance benefits ran out.  Stopped doing PT about 3 wks ago, and they do attempt to do exercises on their own, not the same.  If this is a possiblity to resume please place order.  Thanks

## 2017-08-08 NOTE — Telephone Encounter (Signed)
Physical therapy orders placed.  Not sure that insurance will cover this.

## 2017-08-08 NOTE — Addendum Note (Signed)
Addended by: Trudie Buckler on: 08/08/2017 03:40 PM   Modules accepted: Orders

## 2017-09-20 ENCOUNTER — Telehealth: Payer: Self-pay | Admitting: Neurology

## 2017-09-20 NOTE — Telephone Encounter (Signed)
Pts son called requesting during the pts upcoming appt on 4/2. That Dr. Brett Fairy will clarify if she thinks pt should be driving or not. Richardson Landry has told the pt time and time again that he doesn't think its safe for the pt to drive but will not listen. FYI

## 2017-09-26 ENCOUNTER — Encounter: Payer: Self-pay | Admitting: Neurology

## 2017-09-26 ENCOUNTER — Ambulatory Visit (INDEPENDENT_AMBULATORY_CARE_PROVIDER_SITE_OTHER): Payer: PPO | Admitting: Neurology

## 2017-09-26 VITALS — BP 133/70 | HR 55 | Ht 67.0 in | Wt 144.0 lb

## 2017-09-26 DIAGNOSIS — G903 Multi-system degeneration of the autonomic nervous system: Secondary | ICD-10-CM | POA: Diagnosis not present

## 2017-09-26 DIAGNOSIS — I951 Orthostatic hypotension: Secondary | ICD-10-CM

## 2017-09-26 DIAGNOSIS — G20 Parkinson's disease: Secondary | ICD-10-CM | POA: Diagnosis not present

## 2017-09-26 DIAGNOSIS — F0280 Dementia in other diseases classified elsewhere without behavioral disturbance: Secondary | ICD-10-CM

## 2017-09-26 DIAGNOSIS — G20A1 Parkinson's disease without dyskinesia, without mention of fluctuations: Secondary | ICD-10-CM

## 2017-09-26 MED ORDER — CARBIDOPA-LEVODOPA 25-100 MG PO TABS: 1.0000 | ORAL_TABLET | Freq: Three times a day (TID) | ORAL | 3 refills | Status: AC

## 2017-09-26 NOTE — Progress Notes (Signed)
PATIENT: Austin Juarez DOB: September 22, 1929  REASON FOR VISIT: follow up HISTORY FROM: patient  HISTORY OF PRESENT ILLNESS: Today 09/26/17, I have the pleasure of seeing Austin Juarez today in the presence of his son, he is meanwhile 84 years old today is his birthday.  He was tested for cognitive decline in January and has a lower MMSE score that he had in the past.  He also has some dysautonomia alongside his Parkinson symptoms.  He is taking immediate release Sinemet 25/ 100 mg 3 times a day one hour before a meal- and not longer extended release form . He is very stooped, with visible resting tremor but less dysautonomia.  No freezing episodes.  He has learnt to sit at bed site before raising in AM. No RLS but some delayed sleep latency. He reports his grandchildren are well, Austin Juarez and Austin Juarez, and Austin Juarez used to follow [place] here.    MM-January 2019 .Austin Juarez is an 84 year old male with Parkinson disease.  He returns today for follow-up.  His medication was adjusted June 08, 2017.  He is currently taking Sinemet 25-100 mg 3 times a day and Sinemet extended release 25-100 mg at bedtime.  The patient's physical therapist called in December 26 and reported that he was continued to have multiple falls and she had noted low blood pressure.  At that time Dr. Brett Fairy recommended that therapy should only take place if his systolic blood pressure is greater than 110.  She also advised that the patient should be will hydrated and his medication should be taken 1 hour prior to a meal into therapy.  The patient's family states that he continues to have orthostatic hypotension according to the physical therapist.  They report typically when he stands his systolic blood pressure may drop to the 60s.  They do report that they feel that the blood pressure got worse when we added on Sinemet extended release dose at bedtime.  They feel that the patient has most problems in the morning.  They state that they  typically give the morning dose of Sinemet as soon as he gets up and approximately 30 minutes later he will have his breakfast.  They report that his gait is more unsteady and he had more dizziness in the morning.  They report approximately 1 hour after his Sinemet dose his symptoms do improve.  The patient does have 24-hour care now.  He continues to have some falls.  Physical therapy works with him weekly.  Family reports they have successfully increase his water intake slightly.  They report that he was drinking approximately 1 bottle of water daily and they have got him at least drinking 2 bottles of water daily now.  The family does note that when he is well-hydrated his symptoms do seem to improve.  They return today for an evaluation.   HISTORY 03-21-2017 ,[name] is a 84 year old Caucasian right-handed gentleman with a history of Parkinson's disease. He was recently tried on Requip 3 times a day but this caused micro-sleep attacks, he also had progressive falls more and more frequent. Physical therapy is now involved in at home care he did not meet occupational therapy services but was evaluated for such. He reports that his memory appears not as good and that he sometimes forgets to take his medication so one of our concerns is to start him on a regimen that allows extended release forms of medication. He demonstrated more problems with ambulation, has very small steps  a shuffling g difficulties to raise from a seated position, difficulties to turn. Sitting back on the chair was also very.difficult for him. All his falls were retropulsive.   HISTORY 03/16/15: MM-   Austin Juarez is a 84 year old male with a history of Parkinson's disease. He returns today for follow-up. The patient is currently taking Requip 3 times a day. He feels that this medication has been beneficial. He denies any significant changes since the last visit. Denies any changes with his gait or balance. He does state that he  has learned not to make turns very quickly. He does not use a walker or cane when ambulating. Denies any falls. He states that his tremor is primarily located in the left hand. He denies any changes with his swallowing. He states that if he takes a big bite of food this sometimes causes him some issues. Denies any changes with his sleep. He does state that he gets up typically 3 times a night to urinate. Overall he feels that things have maintained. He returns today for an evaluation.  HISTORY  09/17/14 ([name]): Austin Juarez is a 84 y.o. male Is seen here as a referral/ revisit from [place] , for Tremor and muscle rigor, thought to reflect Parkinson's disease. The patient has been last seen in this office by NP. The patient continues to have a mild jaw tremor, a right-hand dominant tremor or is not seen today at rest. He does have several dermatitis and a masked face. He continues to play golf and is encouraged to continue physical activity. He is taking care of his granddaughter, who lives with them. During my last visit with the patient on 03-29-2012 the patient did better on Mirapex but due to the expense correlated with his medication stayed on twice a day dosing and did not use it 3 times a day. We waited for January of this year when his prescription but unchanged. Laps and EEGs in the last visit were normal an imaging study was scheduled. He remains independent in all activities of daily living. He had noticed some bruising in the past from daily baby aspirin him and has for that reason stopped the aspirin. However, I would like for him to try to take the aspirin every other day to have some of the vascular protective benefits of the medication.  09-17-14 CD Visible resting tremor, right hand dominant with a slight jaw tremor. The patient has some dysphonia but reports no dysphagia. Today's [facility] cognitive assessment test scored at 24 out of 30 points in the last visit he scored 26 out of  30 points. I think he lost his points because his fine motor skills were worse. He is fully oriented but he lost 3 of the 5 recall words. One point and obstruction, and one point in word fluency. He did the serial 7 the attention to words and the digit listing. He is feeling he gets worse, He lives no alone with his wife, his granddaughter "Austin Juarez "who used to reside with him has moved to [place]. His grandchildren are orphans and have alcohol embryopathy.   REVIEW OF SYSTEMS: Out of a complete 14 system review of symptoms, the patient complains only of the following symptoms, and all other reviewed systems are negative.  ALLERGIES: Allergies  Allergen Reactions  . Flomax [Tamsulosin Hcl] Nausea Only    Violently   . Morphine And Related     nausea    HOME MEDICATIONS: Outpatient Medications Prior to Visit  Medication  Sig Dispense Refill  . carbidopa-levodopa (SINEMET IR) 25-100 MG tablet TAKE 1 TABLET BY MOUTH THREE TIMES A DAY 90 tablet 5  . diphenhydrAMINE (BENADRYL) 12.5 MG chewable tablet Chew 12.5 mg by mouth daily as needed for allergies.     . polyethylene glycol powder (GLYCOLAX/MIRALAX) powder Take by mouth as needed for mild constipation.     Marland Kitchen HYDROcodone-acetaminophen (NORCO) 5-325 MG tablet Take 1 tablet by mouth every 6 (six) hours as needed for moderate pain. (Patient not taking: Reported on 06/28/2017) 10 tablet 0   No facility-administered medications prior to visit.     PAST MEDICAL HISTORY: Past Medical History:  Diagnosis Date  . Hypertension   . Kidney stones   . Memory loss   . PD (Parkinson's disease) ([place])     PAST SURGICAL HISTORY: Past Surgical History:  Procedure Laterality Date  . ARM SURGERY Left    CANCER    FAMILY HISTORY: Family History  Problem Relation Age of Onset  . Alcoholism Daughter     SOCIAL HISTORY: Social History   Socioeconomic History  . Marital status: Married    Spouse name: [name]  . Number of children: 2  . Years  of education: 17  . Highest education level: Not on file  Occupational History  . Occupation: retired  Scientific laboratory technician  . Financial resource strain: Not on file  . Food insecurity:    Worry: Not on file    Inability: Not on file  . Transportation needs:    Medical: Not on file    Non-medical: Not on file  Tobacco Use  . Smoking status: Never Smoker  . Smokeless tobacco: Never Used  Substance and Sexual Activity  . Alcohol use: No  . Drug use: No  . Sexual activity: Not on file  Lifestyle  . Physical activity:    Days per week: Not on file    Minutes per session: Not on file  . Stress: Not on file  Relationships  . Social connections:    Talks on phone: Not on file    Gets together: Not on file    Attends religious service: Not on file    Active member of club or organization: Not on file    Attends meetings of clubs or organizations: Not on file    Relationship status: Not on file  . Intimate partner violence:    Fear of current or ex partner: Not on file    Emotionally abused: Not on file    Physically abused: Not on file    Forced sexual activity: Not on file  Other Topics Concern  . Not on file  Social History Narrative   Patient lives at home with wife [name].   Patient has 3 children. 1 deceased.    Patient is retired.    Patient is right handed.    Patient has a college degree.    Caffeine 2 cups coffee daily.          PHYSICAL EXAM  Vitals:   09/26/17 1352  BP: 133/70  Pulse: (!) 55  Weight: 144 lb (65.3 kg)  Height: 5\' 7"  (1.702 m)   Body mass index is 22.55 kg/m.  No data found.  MMSE - Mini Mental State Exam 06/28/2017 03/21/2017  Orientation to time 0 5  Orientation to Place 1 5  Registration 3 3  Attention/ Calculation 1 5  Recall 1 2  Language- name 2 objects 2 2  Language- repeat 1 0  Language- follow  3 step command 3 3  Language- read & follow direction 1 1  Write a sentence 1 1  Copy design 0 1  Total score 14 28       Generalized: Well developed, in no acute distress , slowed in motor and psychological function, decreased cognitive abilities, excessive daytime sleepiness.   Neurological examination  Mentation: Alert oriented to time, place, he is unaware of getting lost in his car.  Dysarthria.  Cranial nerve II-XII:  Loss of smell and decreased sense of taste. Pupils were equal round only sluggishly reactive to light. Extraocular movements were full, visual field were full on confrontational test. Facial mimic is decreased, masked . Uvula  midline. Head turning and shoulder shrug were of decreased ROM.  Motor: abnormal, cog wheeling  motor tone is noted throughout.  Sensory: deferred.  Coordination: slowed  finger-nose- bilaterally. Resting tremor.  Gait and station: Gait is affected , romberg positive, falls backwards.  Reflexes: Deep tendon reflexes are symmetric bilaterally.   DIAGNOSTIC DATA (LABS, IMAGING, TESTING) - I reviewed patient records, labs, notes, testing and imaging myself where available.  Lab Results  Component Value Date   WBC 7.1 06/28/2017   HGB 11.4 (L) 06/28/2017   HCT 33.1 (L) 06/28/2017   MCV 97 06/28/2017   PLT 178 06/28/2017      Component Value Date/Time   NA 143 06/28/2017 1046   K 4.8 06/28/2017 1046   CL 107 (H) 06/28/2017 1046   CO2 22 06/28/2017 1046   GLUCOSE 93 06/28/2017 1046   GLUCOSE 115 (H) 08/20/2015 1421   BUN 38 (H) 06/28/2017 1046   CREATININE 1.27 06/28/2017 1046   CALCIUM 9.0 06/28/2017 1046   PROT 6.1 06/28/2017 1046   ALBUMIN 4.0 06/28/2017 1046   AST 16 06/28/2017 1046   ALT 8 06/28/2017 1046   ALKPHOS 84 06/28/2017 1046   BILITOT 0.5 06/28/2017 1046   GFRNONAA 51 (L) 06/28/2017 1046   GFRAA 59 (L) 06/28/2017 1046   No results found for: CHOL, HDL, LDLCALC, LDLDIRECT, TRIG, CHOLHDL No results found for: HGBA1C No results found for: VITAMINB12 No results found for: TSH    ASSESSMENT AND PLAN 84 y.o. year old male  has a  past medical history of Hypertension, Kidney stones, Memory loss, and PD (Parkinson's disease) ([place]). here with:  1.  Parkinson's disease on tid 25/100 mg sinemet  2.  Abnormality of gait, PD  3.  Frequent falls propulsion and retropulsion .  4.  Memory disturbance, PD dementia.    The patient was having issues with orthostatic hypotension since we added on Sinemet extended release at bedtime.  Dizziness and gait difficulty seems to be worse in the morning. Since last visit with NP [place] , she discontinued Sinemet extended release tablet at bedtime and he is doing better . His cognitive decline is evident. NO DRIVING.     The patient's memory score has also decreased since last visit.  He has gotten ost driving- and is not longer safe to do so.    I will not re-check blood work and a urinalysis today.   I spent approximately 20 with the patient and his son discussing symptoms, medication and therapy. He will follow in 6 month . Refill your medication.   Larey Seat, MD    09/26/2017, 2:04 PM [facility] [address], [place], [place] [address] [phone]

## 2017-10-09 DIAGNOSIS — L570 Actinic keratosis: Secondary | ICD-10-CM | POA: Diagnosis not present

## 2017-10-09 DIAGNOSIS — D485 Neoplasm of uncertain behavior of skin: Secondary | ICD-10-CM | POA: Diagnosis not present

## 2017-10-09 DIAGNOSIS — L812 Freckles: Secondary | ICD-10-CM | POA: Diagnosis not present

## 2017-10-09 DIAGNOSIS — D692 Other nonthrombocytopenic purpura: Secondary | ICD-10-CM | POA: Diagnosis not present

## 2017-10-09 DIAGNOSIS — D1801 Hemangioma of skin and subcutaneous tissue: Secondary | ICD-10-CM | POA: Diagnosis not present

## 2017-10-09 DIAGNOSIS — D0439 Carcinoma in situ of skin of other parts of face: Secondary | ICD-10-CM | POA: Diagnosis not present

## 2017-10-09 DIAGNOSIS — L853 Xerosis cutis: Secondary | ICD-10-CM | POA: Diagnosis not present

## 2017-10-09 DIAGNOSIS — L821 Other seborrheic keratosis: Secondary | ICD-10-CM | POA: Diagnosis not present

## 2017-10-09 DIAGNOSIS — Z85828 Personal history of other malignant neoplasm of skin: Secondary | ICD-10-CM | POA: Diagnosis not present

## 2017-10-31 ENCOUNTER — Encounter: Payer: Self-pay | Admitting: Neurology

## 2017-10-31 ENCOUNTER — Telehealth: Payer: Self-pay | Admitting: Neurology

## 2017-10-31 NOTE — Telephone Encounter (Signed)
Pt's son called said the pt is arguneing that he can drive. Austin Juarez is asking if a letter can be sent to [facility] stating that he is not able to drive. Please call to advise

## 2017-10-31 NOTE — Telephone Encounter (Signed)
I have called the patients son and advised him that we will write a letter and place at the front office for him to pick up

## 2017-11-01 DIAGNOSIS — M7989 Other specified soft tissue disorders: Secondary | ICD-10-CM | POA: Diagnosis not present

## 2017-12-07 DIAGNOSIS — H0102A Squamous blepharitis right eye, upper and lower eyelids: Secondary | ICD-10-CM | POA: Diagnosis not present

## 2017-12-10 ENCOUNTER — Emergency Department (HOSPITAL_COMMUNITY)
Admission: EM | Admit: 2017-12-10 | Discharge: 2017-12-10 | Disposition: A | Discharge status: Home / Self Care | Payer: PPO | Attending: Emergency Medicine | Admitting: Emergency Medicine

## 2017-12-10 ENCOUNTER — Encounter (HOSPITAL_COMMUNITY): Payer: Self-pay

## 2017-12-10 DIAGNOSIS — I951 Orthostatic hypotension: Secondary | ICD-10-CM | POA: Diagnosis not present

## 2017-12-10 DIAGNOSIS — G20 Parkinson's disease: Secondary | ICD-10-CM | POA: Insufficient documentation

## 2017-12-10 DIAGNOSIS — Z79899 Other long term (current) drug therapy: Secondary | ICD-10-CM | POA: Diagnosis not present

## 2017-12-10 DIAGNOSIS — E860 Dehydration: Secondary | ICD-10-CM | POA: Diagnosis not present

## 2017-12-10 DIAGNOSIS — R001 Bradycardia, unspecified: Secondary | ICD-10-CM | POA: Diagnosis not present

## 2017-12-10 DIAGNOSIS — F0280 Dementia in other diseases classified elsewhere without behavioral disturbance: Secondary | ICD-10-CM | POA: Insufficient documentation

## 2017-12-10 DIAGNOSIS — R42 Dizziness and giddiness: Secondary | ICD-10-CM | POA: Diagnosis present

## 2017-12-10 HISTORY — DX: Hypotension, unspecified: I95.9

## 2017-12-10 LAB — CBC WITH DIFFERENTIAL/PLATELET
Abs Immature Granulocytes: 0 10*3/uL (ref 0.0–0.1)
BASOS ABS: 0 10*3/uL (ref 0.0–0.1)
BASOS PCT: 0 %
EOS PCT: 2 %
Eosinophils Absolute: 0.1 10*3/uL (ref 0.0–0.7)
HCT: 38.2 % — ABNORMAL LOW (ref 39.0–52.0)
HEMOGLOBIN: 12.5 g/dL — AB (ref 13.0–17.0)
Immature Granulocytes: 0 %
Lymphocytes Relative: 19 %
Lymphs Abs: 1.3 10*3/uL (ref 0.7–4.0)
MCH: 33 pg (ref 26.0–34.0)
MCHC: 32.7 g/dL (ref 30.0–36.0)
MCV: 100.8 fL — ABNORMAL HIGH (ref 78.0–100.0)
MONO ABS: 0.6 10*3/uL (ref 0.1–1.0)
Monocytes Relative: 8 %
Neutro Abs: 4.8 10*3/uL (ref 1.7–7.7)
Neutrophils Relative %: 71 %
PLATELETS: 171 10*3/uL (ref 150–400)
RBC: 3.79 MIL/uL — ABNORMAL LOW (ref 4.22–5.81)
RDW: 12 % (ref 11.5–15.5)
WBC: 6.9 10*3/uL (ref 4.0–10.5)

## 2017-12-10 LAB — COMPREHENSIVE METABOLIC PANEL
ALK PHOS: 88 U/L (ref 38–126)
ALT: <5 U/L — ABNORMAL LOW (ref 17–63)
ANION GAP: 9 (ref 5–15)
AST: 16 U/L (ref 15–41)
Albumin: 3.6 g/dL (ref 3.5–5.0)
BUN: 30 mg/dL — ABNORMAL HIGH (ref 6–20)
CO2: 25 mmol/L (ref 22–32)
Calcium: 8.6 mg/dL — ABNORMAL LOW (ref 8.9–10.3)
Chloride: 107 mmol/L (ref 101–111)
Creatinine, Ser: 1.37 mg/dL — ABNORMAL HIGH (ref 0.61–1.24)
GFR calc non Af Amer: 45 mL/min — ABNORMAL LOW (ref >60)
GFR, EST AFRICAN AMERICAN: 52 mL/min — AB (ref >60)
Glucose, Bld: 100 mg/dL — ABNORMAL HIGH (ref 65–99)
Potassium: 4.1 mmol/L (ref 3.5–5.1)
SODIUM: 141 mmol/L (ref 135–145)
Total Bilirubin: 0.7 mg/dL (ref 0.3–1.2)
Total Protein: 6.8 g/dL (ref 6.5–8.1)

## 2017-12-10 LAB — URINALYSIS, ROUTINE W REFLEX MICROSCOPIC
BILIRUBIN URINE: NEGATIVE
Glucose, UA: NEGATIVE mg/dL
Hgb urine dipstick: NEGATIVE
KETONES UR: NEGATIVE mg/dL
Leukocytes, UA: NEGATIVE
NITRITE: NEGATIVE
PROTEIN: NEGATIVE mg/dL
SPECIFIC GRAVITY, URINE: 1.011 (ref 1.005–1.030)
pH: 6 (ref 5.0–8.0)

## 2017-12-10 LAB — TROPONIN I

## 2017-12-10 MED ORDER — SODIUM CHLORIDE 0.9 % IV BOLUS
250.0000 mL | Freq: Once | INTRAVENOUS | Status: AC
  Administered 2017-12-10: 250 mL via INTRAVENOUS

## 2017-12-10 NOTE — ED Triage Notes (Signed)
To room via EMS from [facility] independent [facility].  [place] aide reports pt was walking, got pale, weak and lightheaded.  Initial EMS BP 70/50, BP increased to 140/70, HR 5, SpO2 97%, RR 16,CBG 93.

## 2017-12-10 NOTE — Discharge Instructions (Addendum)
Call make appointment follow-up with the cardiologist.  Make sure you are drinking plenty fluids.  Change positions slowly.  Should also wear compression hose and keep lower extremities elevated.  Return for worsening symptoms or any concerns.

## 2017-12-10 NOTE — ED Provider Notes (Signed)
[facility] EMERGENCY DEPARTMENT Provider Note   CSN: [id number] Arrival date & time: 12/10/17  1329     History   Chief Complaint Chief Complaint  Patient presents with  . Dizziness    HPI Austin Juarez is a 84 y.o. male.  HPI Patient had near syncopal episode this afternoon while walking to lunch.  Lives at [facility] and was walking downstairs with his caretaker.  Became lightheaded, mildly nauseated and was helped to a seated position.  No loss of consciousness.  EMS was called and patient was hypertensive with blood pressure of 70/50.  Patient denies chest pain or shortness of breath.  No recent vomiting or diarrhea.  No cough, fever or chills.  Denies new urinary symptoms.  Has ongoing lower extremity swelling.  Recently stopped taking Lasix due to orthostasis.  Currently is asymptomatic. Past Medical History:  Diagnosis Date  . Hypotension   . Kidney stones   . Memory loss   . PD (Parkinson's disease) [facility])     Patient Active Problem List   Diagnosis Date Noted  . PD (Parkinson's disease) ([place]) 09/26/2017  . Dysautonomia orthostatic hypotension syndrome ([place]) 09/26/2017  . Dementia due to Parkinson's disease without behavioral disturbance ([place]) 09/26/2017  . Parkinson's disease (tremor, stiffness, slow motion, unstable posture) ([place]) 03/14/2013    Past Surgical History:  Procedure Laterality Date  . ARM SURGERY Left    CANCER        Home Medications    Prior to Admission medications   Medication Sig Start Date End Date Taking? Authorizing Provider  carbidopa-levodopa (SINEMET IR) 25-100 MG tablet Take 1 tablet by mouth 3 (three) times daily. 09/26/17  Yes Dohmeier, Asencion Partridge, MD  diphenhydrAMINE (BENADRYL) 12.5 MG chewable tablet Chew 12.5 mg by mouth daily as needed for allergies.    Yes [provider]  furosemide (LASIX) 20 MG tablet Take 20 mg by mouth daily as needed for fluid. 11/27/17  Yes [provider]  tobramycin-dexamethasone [facility]) ophthalmic solution Place 1 drop into both eyes 4 (four) times daily. For 5 days 12/07/17 12/12/17 Yes [provider]    Family History Family History  Problem Relation Age of Onset  . Alcoholism Daughter     Social History Social History   Tobacco Use  . Smoking status: Never Smoker  . Smokeless tobacco: Never Used  Substance Use Topics  . Alcohol use: No  . Drug use: No     Allergies   Flomax [tamsulosin hcl] and Morphine and related   Review of Systems Review of Systems  Constitutional: Positive for fatigue. Negative for chills and fever.  HENT: Negative for congestion, sore throat and trouble swallowing.   Eyes: Negative for visual disturbance.  Respiratory: Negative for cough and shortness of breath.   Cardiovascular: Positive for leg swelling. Negative for chest pain and palpitations.  Gastrointestinal: Positive for nausea. Negative for abdominal pain, diarrhea and vomiting.  Genitourinary: Negative for dysuria, flank pain, frequency and hematuria.  Musculoskeletal: Negative for back pain and neck pain.  Skin: Negative for rash and wound.  Neurological: Positive for dizziness and light-headedness. Negative for syncope, weakness, numbness and headaches.  All other systems reviewed and are negative.    Physical Exam Updated Vital Signs BP (!) 168/73   Pulse (!) 48   Temp 97.8 F (36.6 C) (Oral)   Resp 17   SpO2 100%   Physical Exam  Constitutional: He is oriented to person, place, and time. He  appears well-developed and well-nourished. No distress.  HENT:  Head: Normocephalic and atraumatic.  Mouth/Throat: Oropharynx is clear and moist.  No head trauma.  Oropharynx is clear.  Eyes: Pupils are equal, round, and reactive to light. EOM are normal.  Neck: Normal range of motion. Neck supple.  No posterior midline cervical tenderness to palpation.  Cardiovascular: Regular rhythm.  Bradycardia    Pulmonary/Chest: Effort normal and breath sounds normal. No stridor. No respiratory distress. He has no wheezes. He has no rales. He exhibits no tenderness.  Abdominal: Soft. Bowel sounds are normal. There is no tenderness. There is no rebound and no guarding.  Musculoskeletal: Normal range of motion. He exhibits edema. He exhibits no tenderness.  1+ bilateral lower extremity pitting edema.  No asymmetry or tenderness.  Distal pulses intact.  Neurological: He is alert and oriented to person, place, and time.  Moves all extremities without focal deficit.  Sensation intact.  No facial asymmetry.  Skin: Skin is warm and dry. No rash noted. He is not diaphoretic. No erythema.  Psychiatric: He has a normal mood and affect. His behavior is normal.  Nursing note and vitals reviewed.    ED Treatments / Results  Labs (all labs ordered are listed, but only abnormal results are displayed) Labs Reviewed  CBC WITH DIFFERENTIAL/PLATELET - Abnormal; Notable for the following components:      Result Value   RBC 3.79 (*)    Hemoglobin 12.5 (*)    HCT 38.2 (*)    MCV 100.8 (*)    All other components within normal limits  COMPREHENSIVE METABOLIC PANEL - Abnormal; Notable for the following components:   Glucose, Bld 100 (*)    BUN 30 (*)    Creatinine, Ser 1.37 (*)    Calcium 8.6 (*)    ALT <5 (*)    GFR calc non Af Amer 45 (*)    GFR calc Af Amer 52 (*)    All other components within normal limits  TROPONIN I  URINALYSIS, ROUTINE W REFLEX MICROSCOPIC    EKG EKG Interpretation  Date/Time:  Sunday December 10 2017 13:31:10 EDT Ventricular Rate:  54 PR Interval:    QRS Duration: 80 QT Interval:  445 QTC Calculation: 422 R Axis:   65 Text Interpretation:  Sinus rhythm Prolonged PR interval Confirmed by Julianne Rice [phone]) on 12/10/2017 2:08:28 PM   Radiology No results found.  Procedures Procedures (including critical care time)  Medications Ordered in ED Medications  sodium  chloride 0.9 % bolus 250 mL (250 mLs Intravenous New Bag/Given 12/10/17 1644)     Initial Impression / Assessment and Plan / ED Course  I have reviewed the triage vital signs and the nursing notes.  Pertinent labs & imaging results that were available during my care of the patient were reviewed by me and considered in my medical decision making (see chart for details).    Patient does have significant drop in blood pressure with position change.  Remains bradycardic in the emergency department but hypertensive.  Creatinine is mildly elevated which may indicate some agree of dehydration.  Patient does not appear to be on any medication to lower his heart rate.  Is advised to change positions slowly.  Will have follow-up with cardiology and strict return precautions given.  Final Clinical Impressions(s) / ED Diagnoses   Final diagnoses:  Orthostatic hypotension  Dehydration  Bradycardia    ED Discharge Orders    None       Julianne Rice, MD  12/10/17 1739  

## 2017-12-13 ENCOUNTER — Encounter: Payer: Self-pay | Admitting: Cardiology

## 2017-12-13 ENCOUNTER — Ambulatory Visit (INDEPENDENT_AMBULATORY_CARE_PROVIDER_SITE_OTHER): Payer: PPO | Admitting: Cardiology

## 2017-12-13 VITALS — BP 118/62 | HR 56 | Ht 67.0 in | Wt 146.8 lb

## 2017-12-13 DIAGNOSIS — R55 Syncope and collapse: Secondary | ICD-10-CM | POA: Insufficient documentation

## 2017-12-13 DIAGNOSIS — G20 Parkinson's disease: Secondary | ICD-10-CM | POA: Diagnosis not present

## 2017-12-13 DIAGNOSIS — R001 Bradycardia, unspecified: Secondary | ICD-10-CM | POA: Diagnosis not present

## 2017-12-13 DIAGNOSIS — G903 Multi-system degeneration of the autonomic nervous system: Secondary | ICD-10-CM | POA: Diagnosis not present

## 2017-12-13 DIAGNOSIS — I951 Orthostatic hypotension: Secondary | ICD-10-CM

## 2017-12-13 NOTE — Patient Instructions (Signed)
Medication Instructions:  Your physician recommends that you continue on your current medications as directed. Please refer to the Current Medication list given to you today.   Labwork: None  Testing/Procedures: Your physician has requested that you have an echocardiogram. Echocardiography is a painless test that uses sound waves to create images of your heart. It provides your doctor with information about the size and shape of your heart and how well your heart's chambers and valves are working. This procedure takes approximately one hour. There are no restrictions for this procedure.  Your physician has recommended that you wear a holter monitor. Holter monitors are medical devices that record the heart's electrical activity. Doctors most often use these monitors to diagnose arrhythmias. Arrhythmias are problems with the speed or rhythm of the heartbeat. The monitor is a small, portable device. You can wear one while you do your normal daily activities. This is usually used to diagnose what is causing palpitations/syncope (passing out). Wear for 24 hours.   Follow-Up: Your physician recommends that you schedule a follow-up appointment in: 3 weeks.  If you need a refill on your cardiac medications before your next appointment, please call your pharmacy.   Thank you for choosing [facility]! Robyne Peers, RN [phone]

## 2017-12-13 NOTE — Progress Notes (Signed)
Cardiology Consultation:    Date:  12/13/2017   ID:  Austin Juarez, DOB [birth year], MRN [id number]  PCP:  Austin Cruel, MD  Cardiologist:  Austin Campus, MD   Referring MD: Austin Cruel, MD   Chief Complaint  Patient presents with  . Dizziness  Have dizziness  History of Present Illness:    Austin Juarez is a 84 y.o. male who is being seen today for the evaluation of dizziness at the request of Austin Cruel, MD.  He does have Parkinson that was diagnosed about 3 years ago.  For last 6 months he fell down about 10 times.  It looks like the mechanism of those falls is orthostatic.  Recently he ended up going to the hospital because of episodes of falling down and syncope.  He was find to have significant orthostatic hypotension.  He was given more fluid as well as some salt seems to be doing better on top of that his furosemide has been withdrawn.  He was given furosemide initially for swelling of lower extremities however because his dizziness became significantly worse this medication has been discontinued on the sixth of this month.  Denies having any chest pain tightness squeezing pressure burning chest.  His ability to exercise is limited because of Parkinson.  He never had any heart trouble.  He does not have hypertension does not have diabetes he is not sure about his cholesterol.  He smoked for short period of time when he was 37.  Past Medical History:  Diagnosis Date  . Hypotension   . Kidney stones   . Memory loss   . PD (Parkinson's disease) [facility])     Past Surgical History:  Procedure Laterality Date  . ARM SURGERY Left    CANCER    Current Medications: Current Meds  Medication Sig  . carbidopa-levodopa (SINEMET IR) 25-100 MG tablet Take 1 tablet by mouth 3 (three) times daily.  . diphenhydrAMINE (BENADRYL) 12.5 MG chewable tablet Chew 12.5 mg by mouth daily as needed for allergies.      Allergies:   Flomax [tamsulosin hcl] and Morphine  and related   Social History   Socioeconomic History  . Marital status: Married    Spouse name: Austin Juarez  . Number of children: 2  . Years of education: 69  . Highest education level: Not on file  Occupational History  . Occupation: retired  Scientific laboratory technician  . Financial resource strain: Not on file  . Food insecurity:    Worry: Not on file    Inability: Not on file  . Transportation needs:    Medical: Not on file    Non-medical: Not on file  Tobacco Use  . Smoking status: Never Smoker  . Smokeless tobacco: Never Used  Substance and Sexual Activity  . Alcohol use: No  . Drug use: No  . Sexual activity: Not on file  Lifestyle  . Physical activity:    Days per week: Not on file    Minutes per session: Not on file  . Stress: Not on file  Relationships  . Social connections:    Talks on phone: Not on file    Gets together: Not on file    Attends religious service: Not on file    Active member of club or organization: Not on file    Attends meetings of clubs or organizations: Not on file    Relationship status: Not on file  Other Topics Concern  . Not on  file  Social History Narrative   Patient lives at home with wife Austin Juarez.   Patient has 3 children. 1 deceased.    Patient is retired.    Patient is right handed.    Patient has a college degree.    Caffeine 2 cups coffee daily.         Family History: The patient's family history includes Alcoholism in his daughter. ROS:   Please see the history of present illness.    All 14 point review of systems negative except as described per history of present illness.  EKGs/Labs/Other Studies Reviewed:    The following studies were reviewed today: EKG done yesterday showed normal sinus rhythm, prolonged PR interval, normal QS complex duration morphology.   Recent Labs: 12/10/2017: ALT <5; BUN 30; Creatinine, Ser 1.37; Hemoglobin 12.5; Platelets 171; Potassium 4.1; Sodium 141  Recent Lipid Panel No results found for: CHOL,  TRIG, HDL, CHOLHDL, VLDL, LDLCALC, LDLDIRECT  Physical Exam:    VS:  BP 118/62   Pulse (!) 56   Ht 5\' 7"  (1.702 m)   Wt 146 lb 12.8 oz (66.6 kg)   SpO2 97%   BMI 22.99 kg/m     Wt Readings from Last 3 Encounters:  12/13/17 146 lb 12.8 oz (66.6 kg)  09/26/17 144 lb (65.3 kg)  06/28/17 141 lb (64 kg)     GEN:  Well nourished, well developed in no acute distress HEENT: Normal NECK: No JVD; No carotid bruits LYMPHATICS: No lymphadenopathy CARDIAC: RRR, no murmurs, no rubs, no gallops RESPIRATORY:  Clear to auscultation without rales, wheezing or rhonchi  ABDOMEN: Soft, non-tender, non-distended MUSCULOSKELETAL:  No edema; No deformity  SKIN: Warm and dry NEUROLOGIC:  Alert and oriented x 3 PSYCHIATRIC:  Normal affect   ASSESSMENT:    1. Dysautonomia orthostatic hypotension syndrome (HCC)   2. Parkinson's disease (tremor, stiffness, slow motion, unstable posture) (HCC)   3. Syncope, unspecified syncope type    PLAN:    In order of problems listed above:  1. Dysautonomia with orthostatic hypotension syndrome.  I think reaching the point of medication need to be initiated.  2 choices for him Midodrin or Northera.  However before I initiate this medication he needs to have echocardiogram make sure that structurally his heart is normal.  On top of that he did have some bradycardia when he was in the emergency room and he does have first-degree AV block.  He needs to wear Holter monitor for 24 hours to make sure that bradycardia is not the one that is responsible for his episode of dizziness.  He already increase fluid intake as well as salt intake which is appropriate.  He did have some history of swelling of lower extremities therefore doing echocardiogram will be very appropriate to look at his left ventricular ejection fraction. 2. Parkinson's: Followed by internal medicine team and neurology. 3. Syncope: Most likely related to orthostatic hypotension which is related to  dysautonomia.  However he does have some bradycardia and first-degree AV block Holter monitor will be used to determine if there is significant bradycardia this could be responsible for his symptoms.  See him back in my office in about 3 weeks or sooner if he get a problem   Medication Adjustments/Labs and Tests Ordered: Current medicines are reviewed at length with the patient today.  Concerns regarding medicines are outlined above.  No orders of the defined types were placed in this encounter.  No orders of the defined types were placed  in this encounter.   Signed, Park Liter, MD, [facility]. 12/13/2017 10:14 AM    [place]

## 2017-12-14 ENCOUNTER — Other Ambulatory Visit: Payer: Self-pay

## 2017-12-14 ENCOUNTER — Ambulatory Visit (INDEPENDENT_AMBULATORY_CARE_PROVIDER_SITE_OTHER): Payer: PPO

## 2017-12-14 ENCOUNTER — Ambulatory Visit (HOSPITAL_COMMUNITY): Payer: PPO | Attending: Cardiology

## 2017-12-14 DIAGNOSIS — R55 Syncope and collapse: Secondary | ICD-10-CM | POA: Diagnosis not present

## 2017-12-14 DIAGNOSIS — I081 Rheumatic disorders of both mitral and tricuspid valves: Secondary | ICD-10-CM | POA: Insufficient documentation

## 2017-12-14 DIAGNOSIS — R001 Bradycardia, unspecified: Secondary | ICD-10-CM

## 2017-12-14 DIAGNOSIS — I440 Atrioventricular block, first degree: Secondary | ICD-10-CM | POA: Insufficient documentation

## 2017-12-15 ENCOUNTER — Other Ambulatory Visit: Payer: Self-pay | Admitting: Adult Health

## 2018-01-08 ENCOUNTER — Ambulatory Visit: Payer: PPO | Admitting: Cardiology

## 2018-01-18 ENCOUNTER — Ambulatory Visit (INDEPENDENT_AMBULATORY_CARE_PROVIDER_SITE_OTHER): Payer: PPO | Admitting: Cardiology

## 2018-01-18 ENCOUNTER — Encounter: Payer: Self-pay | Admitting: Cardiology

## 2018-01-18 VITALS — BP 130/64 | HR 59 | Ht 67.0 in | Wt 151.8 lb

## 2018-01-18 DIAGNOSIS — G20 Parkinson's disease: Secondary | ICD-10-CM

## 2018-01-18 DIAGNOSIS — G903 Multi-system degeneration of the autonomic nervous system: Secondary | ICD-10-CM

## 2018-01-18 DIAGNOSIS — R55 Syncope and collapse: Secondary | ICD-10-CM

## 2018-01-18 DIAGNOSIS — I951 Orthostatic hypotension: Secondary | ICD-10-CM

## 2018-01-18 NOTE — Patient Instructions (Signed)
Medication Instructions:   Your physician recommends that you continue on your current medications as directed. Please refer to the Current Medication list given to you today.  Labwork:  None  Testing/Procedures:  None  Follow-Up:  Your physician recommends that you schedule a follow-up appointment in: 1 month  Any Other Special Instructions Will Be Listed Below (If Applicable).  If you need a refill on your cardiac medications before your next appointment, please call your pharmacy. 

## 2018-02-09 NOTE — Progress Notes (Signed)
Cardiology Office Note:    Date:  02/09/2018   ID:  Austin Juarez, DOB Nov 08, 1929, MRN [id number]  PCP:  Lawerance Cruel, MD  Cardiologist:  Jenne Campus, MD    Referring MD: Lawerance Cruel, MD   Chief Complaint  Patient presents with  . 3 week follow up  Doing well still some dizziness upon getting up  History of Present Illness:    Austin Juarez is a 84 y.o. male with Parkinson's disease and orthostatic hypotension related to dysautonomia.  He is doing slightly better he increase the fluid intake as well as salt intake and doing better we initiated conversation about potentially start taking medication like midodrine or Northera however he prefer conservative approach therefore I will refer him to have elastic stockings measure and see if that will help.  Past Medical History:  Diagnosis Date  . Hypotension   . Kidney stones   . Memory loss   . PD (Parkinson's disease) [facility])     Past Surgical History:  Procedure Laterality Date  . ARM SURGERY Left    CANCER    Current Medications: Current Meds  Medication Sig  . carbidopa-levodopa (SINEMET IR) 25-100 MG tablet Take 1 tablet by mouth 3 (three) times daily.  . diphenhydrAMINE (BENADRYL) 12.5 MG chewable tablet Chew 12.5 mg by mouth daily as needed for allergies.      Allergies:   Flomax [tamsulosin hcl] and Morphine and related   Social History   Socioeconomic History  . Marital status: Married    Spouse name: [name]  . Number of children: 2  . Years of education: 26  . Highest education level: Not on file  Occupational History  . Occupation: retired  Scientific laboratory technician  . Financial resource strain: Not on file  . Food insecurity:    Worry: Not on file    Inability: Not on file  . Transportation needs:    Medical: Not on file    Non-medical: Not on file  Tobacco Use  . Smoking status: Never Smoker  . Smokeless tobacco: Never Used  Substance and Sexual Activity  . Alcohol use: No  . Drug use:  No  . Sexual activity: Not on file  Lifestyle  . Physical activity:    Days per week: Not on file    Minutes per session: Not on file  . Stress: Not on file  Relationships  . Social connections:    Talks on phone: Not on file    Gets together: Not on file    Attends religious service: Not on file    Active member of club or organization: Not on file    Attends meetings of clubs or organizations: Not on file    Relationship status: Not on file  Other Topics Concern  . Not on file  Social History Narrative   Patient lives at home with wife [name].   Patient has 3 children. 1 deceased.    Patient is retired.    Patient is right handed.    Patient has a college degree.    Caffeine 2 cups coffee daily.         Family History: The patient's family history includes Alcoholism in his daughter. ROS:   Please see the history of present illness.    All 14 point review of systems negative except as described per history of present illness  EKGs/Labs/Other Studies Reviewed:      Recent Labs: 12/10/2017: ALT <5; BUN 30; Creatinine, Ser 1.37; Hemoglobin  12.5; Platelets 171; Potassium 4.1; Sodium 141  Recent Lipid Panel No results found for: CHOL, TRIG, HDL, CHOLHDL, VLDL, LDLCALC, LDLDIRECT  Physical Exam:    VS:  BP 130/64   Pulse (!) 59   Ht 5\' 7"  (1.702 m)   Wt 151 lb 12.8 oz (68.9 kg)   SpO2 96%   BMI 23.78 kg/m     Wt Readings from Last 3 Encounters:  01/18/18 151 lb 12.8 oz (68.9 kg)  12/13/17 146 lb 12.8 oz (66.6 kg)  09/26/17 144 lb (65.3 kg)     GEN:  Well nourished, well developed in no acute distress HEENT: Normal NECK: No JVD; No carotid bruits LYMPHATICS: No lymphadenopathy CARDIAC: RRR, no murmurs, no rubs, no gallops RESPIRATORY:  Clear to auscultation without rales, wheezing or rhonchi  ABDOMEN: Soft, non-tender, non-distended MUSCULOSKELETAL:  No edema; No deformity  SKIN: Warm and dry LOWER EXTREMITIES: no swelling NEUROLOGIC:  Alert and oriented  x 3 PSYCHIATRIC:  Normal affect   ASSESSMENT:    1. Dysautonomia orthostatic hypotension syndrome (HCC)   2. Parkinson's disease (tremor, stiffness, slow motion, unstable posture) (HCC)   3. Syncope, unspecified syncope type    PLAN:    In order of problems listed above:  1. Dysautonomia related to Parkinson disease he is to increase fluid intake as well as salt intake.  Will use elastic stocking received that will be sufficient if not Midrin on Northera will be initiated. 2. Syncope: Denies having any. 3. Parkinson's disease followed by internal medicine team and neurology   Medication Adjustments/Labs and Tests Ordered: Current medicines are reviewed at length with the patient today.  Concerns regarding medicines are outlined above.  No orders of the defined types were placed in this encounter.  Medication changes: No orders of the defined types were placed in this encounter.   Signed, Park Liter, MD, [facility] 02/09/2018 1:16 PM    [place]

## 2018-02-22 ENCOUNTER — Ambulatory Visit (INDEPENDENT_AMBULATORY_CARE_PROVIDER_SITE_OTHER): Payer: PPO | Admitting: Cardiology

## 2018-02-22 ENCOUNTER — Encounter: Payer: Self-pay | Admitting: Cardiology

## 2018-02-22 VITALS — BP 172/70 | HR 57 | Ht 67.0 in | Wt 153.1 lb

## 2018-02-22 DIAGNOSIS — G903 Multi-system degeneration of the autonomic nervous system: Secondary | ICD-10-CM | POA: Diagnosis not present

## 2018-02-22 DIAGNOSIS — G20 Parkinson's disease: Secondary | ICD-10-CM

## 2018-02-22 DIAGNOSIS — I951 Orthostatic hypotension: Secondary | ICD-10-CM

## 2018-02-22 DIAGNOSIS — R55 Syncope and collapse: Secondary | ICD-10-CM | POA: Diagnosis not present

## 2018-02-22 NOTE — Patient Instructions (Signed)
Medication Instructions:  Your physician recommends that you continue on your current medications as directed. Please refer to the Current Medication list given to you today.   Labwork: None  Testing/Procedures: None  Follow-Up: Your physician wants you to follow-up in: 3 months. You will receive a reminder letter in the mail two months in advance. If you don't receive a letter, please call our office to schedule the follow-up appointment.   If you need a refill on your cardiac medications before your next appointment, please call your pharmacy.   Thank you for choosing [facility]! Robyne Peers, RN [phone]

## 2018-02-22 NOTE — Progress Notes (Signed)
Cardiology Office Note:    Date:  02/22/2018   ID:  [name], DOB 1929/11/19, MRN [id number]  PCP:  Lawerance Cruel, MD  Cardiologist:  Jenne Campus, MD    Referring MD: Lawerance Cruel, MD   Chief Complaint  Patient presents with  . Follow-up    1 month follow up   Doing better  History of Present Illness:    Austin Juarez is a 84 y.o. male with orthostatic hypotension related to Parkinson we tried to continue with conservative approach to this problem he drinks more fluid, eat more salty food he does have laxity stockings although this seems to be helping somewhat.  I do not think we reached the point of medications needed I told him that in the future if dizziness and unsteadiness upon getting up became worse we may consider adding medication however at this point I do not think it is needed we did talk about precautionary measures that he can take make sure he will not.  He said that all his dizziness is in the morning in the afternoon and evening time is better according to his son previously he also have difficulty with dizziness and unsteadiness at evening time and that improved now.  I told him that in the morning he need to gets up very slowly and also he need to make sure he drink something before he gets up.  Past Medical History:  Diagnosis Date  . Hypotension   . Kidney stones   . Memory loss   . PD (Parkinson's disease) [facility])     Past Surgical History:  Procedure Laterality Date  . ARM SURGERY Left    CANCER    Current Medications: No outpatient medications have been marked as taking for the 02/22/18 encounter (Office Visit) with Park Liter, MD.     Allergies:   Flomax [tamsulosin hcl] and Morphine and related   Social History   Socioeconomic History  . Marital status: Married    Spouse name: [name]  . Number of children: 2  . Years of education: 30  . Highest education level: Not on file  Occupational History  . Occupation:  retired  Scientific laboratory technician  . Financial resource strain: Not on file  . Food insecurity:    Worry: Not on file    Inability: Not on file  . Transportation needs:    Medical: Not on file    Non-medical: Not on file  Tobacco Use  . Smoking status: Never Smoker  . Smokeless tobacco: Never Used  Substance and Sexual Activity  . Alcohol use: No  . Drug use: No  . Sexual activity: Not on file  Lifestyle  . Physical activity:    Days per week: Not on file    Minutes per session: Not on file  . Stress: Not on file  Relationships  . Social connections:    Talks on phone: Not on file    Gets together: Not on file    Attends religious service: Not on file    Active member of club or organization: Not on file    Attends meetings of clubs or organizations: Not on file    Relationship status: Not on file  Other Topics Concern  . Not on file  Social History Narrative   Patient lives at home with wife [name].   Patient has 3 children. 1 deceased.    Patient is retired.    Patient is right handed.    Patient  has a college degree.    Caffeine 2 cups coffee daily.         Family History: The patient's family history includes Alcoholism in his daughter; Stroke in his sister. There is no history of Heart attack. ROS:   Please see the history of present illness.    All 14 point review of systems negative except as described per history of present illness  EKGs/Labs/Other Studies Reviewed:      Recent Labs: 12/10/2017: ALT <5; BUN 30; Creatinine, Ser 1.37; Hemoglobin 12.5; Platelets 171; Potassium 4.1; Sodium 141  Recent Lipid Panel No results found for: CHOL, TRIG, HDL, CHOLHDL, VLDL, LDLCALC, LDLDIRECT  Physical Exam:    VS:  BP (!) 172/70 (BP Location: Right Arm, Patient Position: Sitting, Cuff Size: Normal)   Pulse (!) 57   Ht 5\' 7"  (1.702 m)   Wt 153 lb 1.9 oz (69.5 kg)   SpO2 98%   BMI 23.98 kg/m     Wt Readings from Last 3 Encounters:  02/22/18 153 lb 1.9 oz (69.5 kg)    01/18/18 151 lb 12.8 oz (68.9 kg)  12/13/17 146 lb 12.8 oz (66.6 kg)     GEN:  Well nourished, well developed in no acute distress HEENT: Normal NECK: No JVD; No carotid bruits LYMPHATICS: No lymphadenopathy CARDIAC: RRR, no murmurs, no rubs, no gallops RESPIRATORY:  Clear to auscultation without rales, wheezing or rhonchi  ABDOMEN: Soft, non-tender, non-distended MUSCULOSKELETAL:  No edema; No deformity  SKIN: Warm and dry LOWER EXTREMITIES: no swelling NEUROLOGIC:  Alert and oriented x 3 PSYCHIATRIC:  Normal affect   ASSESSMENT:    1. Dysautonomia orthostatic hypotension syndrome (HCC)   2. Parkinson's disease (tremor, stiffness, slow motion, unstable posture) (HCC)   3. Syncope, unspecified syncope type    PLAN:    In order of problems listed above:  1. Orthostatic hypotension related to Parkinson I do not think we reached the point of medications yet I will see him back in my office in about 3 months to revisit those issues. 2. Parkinson's: Followed by internal medicine team. 3. Syncope.  Denies having any.   Medication Adjustments/Labs and Tests Ordered: Current medicines are reviewed at length with the patient today.  Concerns regarding medicines are outlined above.  No orders of the defined types were placed in this encounter.  Medication changes: No orders of the defined types were placed in this encounter.   Signed, Park Liter, MD, [facility] 02/22/2018 11:06 AM    [place]

## 2018-04-04 ENCOUNTER — Encounter: Payer: Self-pay | Admitting: Adult Health

## 2018-04-04 ENCOUNTER — Ambulatory Visit (INDEPENDENT_AMBULATORY_CARE_PROVIDER_SITE_OTHER): Payer: PPO | Admitting: Adult Health

## 2018-04-04 VITALS — BP 129/69 | HR 62 | Ht 67.0 in | Wt 154.8 lb

## 2018-04-04 DIAGNOSIS — G20 Parkinson's disease: Secondary | ICD-10-CM

## 2018-04-04 DIAGNOSIS — R413 Other amnesia: Secondary | ICD-10-CM

## 2018-04-04 NOTE — Patient Instructions (Signed)
Your Plan:  Continue Sinemet 25-100 mg three times a day Memory score has improved If your symptoms worsen or you develop new symptoms please let us know.   Thank you for coming to see [place] at [facility]. I hope we have been able to provide you high quality care today.  You may receive a patient satisfaction survey over the next few weeks. We would appreciate your feedback and comments so that we may continue to improve ourselves and the health of our patients.

## 2018-04-04 NOTE — Progress Notes (Signed)
I have read the note, and I agree with the clinical assessment and plan.  Austin Juarez   

## 2018-04-04 NOTE — Progress Notes (Signed)
PATIENT: Austin Juarez DOB: July 21, 1929  REASON FOR VISIT: follow up HISTORY FROM: patient  HISTORY OF PRESENT ILLNESS: Today 04/04/18:  Mr. Austin Juarez is an 84 year old male with a history of Parkinson's disease and memory disturbance.  He returns today for follow-up.  He is currently taking Sinemet 1 tablet 3 times a day.  He reports that this continues to work well for him.  He currently lives at [facility].  He has an aide that with him most of the day.  He is able to complete all ADLs independently.  Reports good appetite.  Reports that he sleeps okay.  He states that his tremor has remained stable.  It primarily affects the hands right greater than left.  He states that he had a fall a couple weeks ago.   he was trying to walk backwards and he fell.  Fortunately he did not sustain any injuries.  He does use a Rollator.  His son who is with him today reports that cardiology feels that some of his falls are related to low blood pressure.  They have instructed him to increase his salt and water intake.  He returns today for evaluation.  HISTORY 09/26/17, I have the pleasure of seeing Mr. Austin Juarez today in the presence of his son, he is meanwhile 83 years old today is his birthday.  He was tested for cognitive decline in January and has a lower MMSE score that he had in the past.  He also has some dysautonomia alongside his Parkinson symptoms.  He is taking immediate release Sinemet 25/ 100 mg 3 times a day one hour before a meal- and not longer extended release form . He is very stooped, with visible resting tremor but less dysautonomia.  No freezing episodes.  He has learnt to sit at bed site before raising in AM. No RLS but some delayed sleep latency. He reports his grandchildren are well, Austin Juarez and Austin Juarez, and Austin Juarez used to follow [place] here.   REVIEW OF SYSTEMS: Out of a complete 14 system review of symptoms, the patient complains only of the following symptoms, and all other reviewed  systems are negative.  Eye redness, eye pain, leg swelling, drooling, daytime sleepiness, confusion, weakness, tremors, dizziness, memory loss, bruise easily  ALLERGIES: Allergies  Allergen Reactions  . Flomax [Tamsulosin Hcl] Nausea Only    Violently   . Morphine And Related Other (See Comments)    nausea    HOME MEDICATIONS: Outpatient Medications Prior to Visit  Medication Sig Dispense Refill  . carbidopa-levodopa (SINEMET IR) 25-100 MG tablet Take 1 tablet by mouth 3 (three) times daily. 270 tablet 3  . diphenhydrAMINE (BENADRYL) 12.5 MG chewable tablet Chew 12.5 mg by mouth daily as needed for allergies.      No facility-administered medications prior to visit.     PAST MEDICAL HISTORY: Past Medical History:  Diagnosis Date  . Hypotension   . Kidney stones   . Memory loss   . PD (Parkinson's disease) ([place])     PAST SURGICAL HISTORY: Past Surgical History:  Procedure Laterality Date  . ARM SURGERY Left    CANCER    FAMILY HISTORY: Family History  Problem Relation Age of Onset  . Stroke Sister   . Alcoholism Daughter   . Heart attack Neg Hx     SOCIAL HISTORY: Social History   Socioeconomic History  . Marital status: Married    Spouse name: [name]  . Number of children: 2  . Years of  education: 16  . Highest education level: Not on file  Occupational History  . Occupation: retired  Scientific laboratory technician  . Financial resource strain: Not on file  . Food insecurity:    Worry: Not on file    Inability: Not on file  . Transportation needs:    Medical: Not on file    Non-medical: Not on file  Tobacco Use  . Smoking status: Never Smoker  . Smokeless tobacco: Never Used  Substance and Sexual Activity  . Alcohol use: No  . Drug use: No  . Sexual activity: Not on file  Lifestyle  . Physical activity:    Days per week: Not on file    Minutes per session: Not on file  . Stress: Not on file  Relationships  . Social connections:    Talks on phone: Not on  file    Gets together: Not on file    Attends religious service: Not on file    Active member of club or organization: Not on file    Attends meetings of clubs or organizations: Not on file    Relationship status: Not on file  . Intimate partner violence:    Fear of current or ex partner: Not on file    Emotionally abused: Not on file    Physically abused: Not on file    Forced sexual activity: Not on file  Other Topics Concern  . Not on file  Social History Narrative   Patient lives at home with wife [name].   Patient has 3 children. 1 deceased.    Patient is retired.    Patient is right handed.    Patient has a college degree.    Caffeine 2 cups coffee daily.          PHYSICAL EXAM  Vitals:   04/04/18 1343  Weight: 154 lb 12.8 oz (70.2 kg)  Height: 5\' 7"  (1.702 m)   Body mass index is 24.25 kg/m.  MMSE - Mini Mental State Exam 04/04/2018 06/28/2017 03/21/2017  Not completed: (No Data) - -  Orientation to time 2 0 5  Orientation to Place 3 1 5   Registration 3 3 3   Attention/ Calculation 4 1 5   Recall 1 1 2   Language- name 2 objects 2 2 2   Language- repeat 1 1 0  Language- follow 3 step command 2 3 3   Language- read & follow direction 1 1 1   Write a sentence 1 1 1   Copy design 0 0 1  Total score 20 14 28     Generalized: Well developed, in no acute distress   Neurological examination  Mentation: Alert oriented to time, place, history taking. Follows all commands speech and language fluent.  Masking of the face noted. Cranial nerve II-XII:  Extraocular movements were full, visual field were full on confrontational test. Facial sensation and strength were normal. Uvula tongue midline. Head turning and shoulder shrug  were normal and symmetric. Motor: The motor testing reveals 5 over 5 strength of all 4 extremities. Good symmetric motor tone is noted throughout.  Sensory: Sensory testing is intact to soft touch on all 4 extremities. No evidence of extinction is noted.   Coordination: Cerebellar testing reveals good finger-nose-finger and heel-to-shin bilaterally.  Gait and station: Patient uses a Rollator when ambulating.  The patient has mild shuffling gait, slightly stooped posture.  Tandem gait not attempted. Reflexes: Deep tendon reflexes are symmetric and normal bilaterally.   DIAGNOSTIC DATA (LABS, IMAGING, TESTING) - I reviewed patient records,  labs, notes, testing and imaging myself where available.  Lab Results  Component Value Date   WBC 6.9 12/10/2017   HGB 12.5 (L) 12/10/2017   HCT 38.2 (L) 12/10/2017   MCV 100.8 (H) 12/10/2017   PLT 171 12/10/2017      Component Value Date/Time   NA 141 12/10/2017 1440   NA 143 06/28/2017 1046   K 4.1 12/10/2017 1440   CL 107 12/10/2017 1440   CO2 25 12/10/2017 1440   GLUCOSE 100 (H) 12/10/2017 1440   BUN 30 (H) 12/10/2017 1440   BUN 38 (H) 06/28/2017 1046   CREATININE 1.37 (H) 12/10/2017 1440   CALCIUM 8.6 (L) 12/10/2017 1440   PROT 6.8 12/10/2017 1440   PROT 6.1 06/28/2017 1046   ALBUMIN 3.6 12/10/2017 1440   ALBUMIN 4.0 06/28/2017 1046   AST 16 12/10/2017 1440   ALT <5 (L) 12/10/2017 1440   ALKPHOS 88 12/10/2017 1440   BILITOT 0.7 12/10/2017 1440   BILITOT 0.5 06/28/2017 1046   GFRNONAA 45 (L) 12/10/2017 1440   GFRAA 52 (L) 12/10/2017 1440     ASSESSMENT AND PLAN 84 y.o. year old male  has a past medical history of Hypotension, Kidney stones, Memory loss, and PD (Parkinson's disease) ([place]). here with:  1. Parkinson's disease 2. Memory disturbance   Overall the patient has remained stable.  He will continue on Sinemet 1 tablet 3 times a day.  Memory score has improved since the last visit.  We will continue to monitor.  Patient is encouraged to continue using Rollator at all times in order to minimize fall risk.  I have advised that if his symptoms worsen or he develops new symptoms he should let us know.  He will follow-up in 6 months or sooner if needed.    Ward Givens, MSN,  NP-C 04/04/2018, 1:53 PM [facility] [address], [place], [place] [address] [phone]

## 2018-04-10 DIAGNOSIS — D0439 Carcinoma in situ of skin of other parts of face: Secondary | ICD-10-CM | POA: Diagnosis not present

## 2018-04-10 DIAGNOSIS — L308 Other specified dermatitis: Secondary | ICD-10-CM | POA: Diagnosis not present

## 2018-04-10 DIAGNOSIS — D485 Neoplasm of uncertain behavior of skin: Secondary | ICD-10-CM | POA: Diagnosis not present

## 2018-04-10 DIAGNOSIS — D1801 Hemangioma of skin and subcutaneous tissue: Secondary | ICD-10-CM | POA: Diagnosis not present

## 2018-04-10 DIAGNOSIS — L570 Actinic keratosis: Secondary | ICD-10-CM | POA: Diagnosis not present

## 2018-04-10 DIAGNOSIS — Z85828 Personal history of other malignant neoplasm of skin: Secondary | ICD-10-CM | POA: Diagnosis not present

## 2018-04-30 DIAGNOSIS — S51011D Laceration without foreign body of right elbow, subsequent encounter: Secondary | ICD-10-CM | POA: Diagnosis not present

## 2018-05-02 DIAGNOSIS — H10501 Unspecified blepharoconjunctivitis, right eye: Secondary | ICD-10-CM | POA: Diagnosis not present

## 2018-05-09 DIAGNOSIS — S0100XA Unspecified open wound of scalp, initial encounter: Secondary | ICD-10-CM | POA: Diagnosis not present

## 2018-05-11 DIAGNOSIS — R296 Repeated falls: Secondary | ICD-10-CM | POA: Diagnosis not present

## 2018-05-11 DIAGNOSIS — S0101XA Laceration without foreign body of scalp, initial encounter: Secondary | ICD-10-CM | POA: Diagnosis not present

## 2018-05-12 ENCOUNTER — Emergency Department (HOSPITAL_COMMUNITY): Payer: PPO

## 2018-05-12 ENCOUNTER — Encounter (HOSPITAL_COMMUNITY): Payer: Self-pay | Admitting: Emergency Medicine

## 2018-05-12 ENCOUNTER — Emergency Department (HOSPITAL_COMMUNITY)
Admission: EM | Admit: 2018-05-12 | Discharge: 2018-05-12 | Disposition: A | Discharge status: Home / Self Care | Payer: PPO | Attending: Emergency Medicine | Admitting: Emergency Medicine

## 2018-05-12 DIAGNOSIS — W1830XA Fall on same level, unspecified, initial encounter: Secondary | ICD-10-CM | POA: Diagnosis not present

## 2018-05-12 DIAGNOSIS — R296 Repeated falls: Secondary | ICD-10-CM | POA: Insufficient documentation

## 2018-05-12 DIAGNOSIS — G20 Parkinson's disease: Secondary | ICD-10-CM | POA: Diagnosis not present

## 2018-05-12 DIAGNOSIS — S299XXA Unspecified injury of thorax, initial encounter: Secondary | ICD-10-CM | POA: Diagnosis not present

## 2018-05-12 DIAGNOSIS — E860 Dehydration: Secondary | ICD-10-CM

## 2018-05-12 DIAGNOSIS — R531 Weakness: Secondary | ICD-10-CM | POA: Diagnosis present

## 2018-05-12 DIAGNOSIS — W19XXXA Unspecified fall, initial encounter: Secondary | ICD-10-CM

## 2018-05-12 LAB — URINALYSIS, ROUTINE W REFLEX MICROSCOPIC
Bilirubin Urine: NEGATIVE
Glucose, UA: NEGATIVE mg/dL
Hgb urine dipstick: NEGATIVE
Ketones, ur: NEGATIVE mg/dL
LEUKOCYTES UA: NEGATIVE
NITRITE: NEGATIVE
PH: 5 (ref 5.0–8.0)
Protein, ur: NEGATIVE mg/dL
SPECIFIC GRAVITY, URINE: 1.015 (ref 1.005–1.030)

## 2018-05-12 LAB — I-STAT TROPONIN, ED: Troponin i, poc: 0.01 ng/mL (ref 0.00–0.08)

## 2018-05-12 LAB — CBC
HEMATOCRIT: 38.2 % — AB (ref 39.0–52.0)
HEMOGLOBIN: 12.4 g/dL — AB (ref 13.0–17.0)
MCH: 34 pg (ref 26.0–34.0)
MCHC: 32.5 g/dL (ref 30.0–36.0)
MCV: 104.7 fL — ABNORMAL HIGH (ref 80.0–100.0)
NRBC: 0 % (ref 0.0–0.2)
Platelets: 157 10*3/uL (ref 150–400)
RBC: 3.65 MIL/uL — AB (ref 4.22–5.81)
RDW: 12.4 % (ref 11.5–15.5)
WBC: 7.7 10*3/uL (ref 4.0–10.5)

## 2018-05-12 LAB — COMPREHENSIVE METABOLIC PANEL
ALBUMIN: 4.2 g/dL (ref 3.5–5.0)
ALT: 5 U/L (ref 0–44)
AST: 20 U/L (ref 15–41)
Alkaline Phosphatase: 73 U/L (ref 38–126)
Anion gap: 8 (ref 5–15)
BUN: 47 mg/dL — AB (ref 8–23)
CO2: 25 mmol/L (ref 22–32)
Calcium: 9 mg/dL (ref 8.9–10.3)
Chloride: 109 mmol/L (ref 98–111)
Creatinine, Ser: 1.46 mg/dL — ABNORMAL HIGH (ref 0.61–1.24)
GFR calc Af Amer: 48 mL/min — ABNORMAL LOW (ref >60)
GFR calc non Af Amer: 42 mL/min — ABNORMAL LOW (ref >60)
GLUCOSE: 103 mg/dL — AB (ref 70–99)
Potassium: 3.7 mmol/L (ref 3.5–5.1)
Sodium: 142 mmol/L (ref 135–145)
TOTAL PROTEIN: 7 g/dL (ref 6.5–8.1)
Total Bilirubin: 0.7 mg/dL (ref 0.3–1.2)

## 2018-05-12 LAB — BRAIN NATRIURETIC PEPTIDE: B Natriuretic Peptide: 88.4 pg/mL (ref 0.0–100.0)

## 2018-05-12 MED ORDER — SODIUM CHLORIDE 0.9 % IV BOLUS
1000.0000 mL | Freq: Once | INTRAVENOUS | Status: AC
  Administered 2018-05-12: 1000 mL via INTRAVENOUS

## 2018-05-12 NOTE — Discharge Instructions (Addendum)
You were evaluated in the Emergency Department and after careful evaluation, we did not find any emergent condition requiring admission or further testing in the hospital.  Your symptoms today seem to be due to mild dehydration.  Please increase your fluid intake at home and follow-up with your regular doctor.  Please return to the Emergency Department if you experience any worsening of your condition.  We encourage you to follow up with a primary care provider.  Thank you for allowing [place] to be a part of your care.

## 2018-05-12 NOTE — ED Notes (Signed)
Pt son and POA signed for pt

## 2018-05-12 NOTE — ED Provider Notes (Signed)
[facility] Emergency Department Provider Note MRN:  [id number]  Arrival date & time: 05/12/18     Chief Complaint   Frequent Falls and Dehydration   History of Present Illness   Austin Juarez is a 84 y.o. year-old male with a history of Parkinson's disease presenting to the ED with chief complaint of frequent falls.  Patient and family explained that he had a ground-level fall 1 week ago, 3 days ago, and now 4 times today.  No significant head trauma or loss of consciousness.  Appears to be having generalized weakness.  Family concerned that he is not eating or drinking enough, likely dehydrated.  Denies any recent fever, no cough, no chest pain or shortness of breath, no dizziness, no abdominal pain.  Endorsing some left mild hip pain from a fall last week, but improving.  Denies dysuria, no diarrhea, no vomiting.  No medication changes.  No numbness or weakness to the arms or legs.  Review of Systems  A complete 10 system review of systems was obtained and all systems are negative except as noted in the HPI and PMH.   Patient's Health History    Past Medical History:  Diagnosis Date  . Hypotension   . Kidney stones   . Memory loss   . PD (Parkinson's disease) [facility])     Past Surgical History:  Procedure Laterality Date  . ARM SURGERY Left    CANCER    Family History  Problem Relation Age of Onset  . Stroke Sister   . Alcoholism Daughter   . Heart attack Neg Hx     Social History   Socioeconomic History  . Marital status: Married    Spouse name: [name]  . Number of children: 2  . Years of education: 68  . Highest education level: Not on file  Occupational History  . Occupation: retired  Scientific laboratory technician  . Financial resource strain: Not on file  . Food insecurity:    Worry: Not on file    Inability: Not on file  . Transportation needs:    Medical: Not on file    Non-medical: Not on file  Tobacco Use  . Smoking status: Never Smoker  .  Smokeless tobacco: Never Used  Substance and Sexual Activity  . Alcohol use: No  . Drug use: No  . Sexual activity: Not on file  Lifestyle  . Physical activity:    Days per week: Not on file    Minutes per session: Not on file  . Stress: Not on file  Relationships  . Social connections:    Talks on phone: Not on file    Gets together: Not on file    Attends religious service: Not on file    Active member of club or organization: Not on file    Attends meetings of clubs or organizations: Not on file    Relationship status: Not on file  . Intimate partner violence:    Fear of current or ex partner: Not on file    Emotionally abused: Not on file    Physically abused: Not on file    Forced sexual activity: Not on file  Other Topics Concern  . Not on file  Social History Narrative   Patient lives at home with wife [name].   Patient has 3 children. 1 deceased.    Patient is retired.    Patient is right handed.    Patient has a college degree.    Caffeine 2  cups coffee daily.         Physical Exam  Vital Signs and Nursing Notes reviewed Vitals:   05/12/18 1742 05/12/18 1815  BP: (!) 156/77 (!) 165/78  Pulse: 64 62  Resp: 16 18  Temp:    SpO2: 100% 100%    CONSTITUTIONAL: Well-appearing, NAD NEURO:  Alert and oriented x 3, no focal deficits, mildly confused but reportedly at baseline EYES:  eyes equal and reactive ENT/NECK:  no LAD, no JVD CARDIO: Regular rate, well-perfused, normal S1 and S2 PULM:  CTAB no wheezing or rhonchi GI/GU:  normal bowel sounds, non-distended, non-tender MSK/SPINE:  No gross deformities, no edema, minimal tenderness palpation to the left hip with normal range of motion SKIN:  no rash, atraumatic PSYCH:  Appropriate speech and behavior  Diagnostic and Interventional Summary    EKG Interpretation  Date/Time:    Ventricular Rate:    PR Interval:    QRS Duration:   QT Interval:    QTC Calculation:   R Axis:     Text Interpretation:         Labs Reviewed  CBC - Abnormal; Notable for the following components:      Result Value   RBC 3.65 (*)    Hemoglobin 12.4 (*)    HCT 38.2 (*)    MCV 104.7 (*)    All other components within normal limits  COMPREHENSIVE METABOLIC PANEL - Abnormal; Notable for the following components:   Glucose, Bld 103 (*)    BUN 47 (*)    Creatinine, Ser 1.46 (*)    GFR calc non Af Amer 42 (*)    GFR calc Af Amer 48 (*)    All other components within normal limits  BRAIN NATRIURETIC PEPTIDE  URINALYSIS, ROUTINE W REFLEX MICROSCOPIC  I-STAT TROPONIN, ED    DG Chest 2 View  Final Result      Medications  sodium chloride 0.9 % bolus 1,000 mL (0 mLs Intravenous Stopped 05/12/18 1849)     Procedures Critical Care  ED Course and Medical Decision Making  I have reviewed the triage vital signs and the nursing notes.  Pertinent labs & imaging results that were available during my care of the patient were reviewed by me and considered in my medical decision making (see below for details).  Unclear reason for frequent falls in this 84 year old male, family concerned about possible dehydration.  No chest pain or shortness of breath, no focal neurological deficits, nontraumatic exam.  Low suspicion for CNS etiology, will obtain screening labs, urinalysis, chest x-ray.  Would consider CT head if work-up unremarkable.  Labs largely reassuring, BMP suggestive of mild dehydration.  Urinalysis unremarkable, chest x-ray on concerning.  Patient able to walk in the ED at baseline.  Will follow with PCP.  After the discussed management above, the patient was determined to be safe for discharge.  The patient was in agreement with this plan and all questions regarding their care were answered.  ED return precautions were discussed and the patient will return to the ED with any significant worsening of condition.  Barth Kirks. Sedonia Small, MD [place] [email or web address]  Final Clinical Impressions(s) / ED Diagnoses     ICD-10-CM   1. Dehydration E86.0   2. Fall W19.Merril Abbe DG Chest 2 View    DG Chest 2 View    ED Discharge Orders    None         Stacey Sago, Barth Kirks, MD  05/12/18 2013  

## 2018-05-12 NOTE — ED Triage Notes (Signed)
Patient here with family from home with complaints of frequent falls. 4 falls today per family, hx of same. States that patient lives alone but has a caregiver during the day. Family suspects dehydration, stating patient does not drink much.

## 2018-05-14 ENCOUNTER — Telehealth: Payer: Self-pay | Admitting: Neurology

## 2018-05-14 ENCOUNTER — Encounter (HOSPITAL_COMMUNITY): Payer: Self-pay | Admitting: Neurology

## 2018-05-14 ENCOUNTER — Emergency Department (HOSPITAL_COMMUNITY): Payer: PPO

## 2018-05-14 ENCOUNTER — Emergency Department (HOSPITAL_COMMUNITY)
Admission: EM | Admit: 2018-05-14 | Discharge: 2018-05-14 | Disposition: A | Discharge status: Home / Self Care | Payer: PPO | Attending: Emergency Medicine | Admitting: Emergency Medicine

## 2018-05-14 DIAGNOSIS — R296 Repeated falls: Secondary | ICD-10-CM | POA: Insufficient documentation

## 2018-05-14 DIAGNOSIS — R2681 Unsteadiness on feet: Secondary | ICD-10-CM | POA: Diagnosis not present

## 2018-05-14 DIAGNOSIS — W19XXXA Unspecified fall, initial encounter: Secondary | ICD-10-CM | POA: Diagnosis not present

## 2018-05-14 DIAGNOSIS — R0902 Hypoxemia: Secondary | ICD-10-CM | POA: Diagnosis not present

## 2018-05-14 DIAGNOSIS — R270 Ataxia, unspecified: Secondary | ICD-10-CM | POA: Diagnosis not present

## 2018-05-14 DIAGNOSIS — F0280 Dementia in other diseases classified elsewhere without behavioral disturbance: Secondary | ICD-10-CM | POA: Insufficient documentation

## 2018-05-14 DIAGNOSIS — G20 Parkinson's disease: Secondary | ICD-10-CM | POA: Insufficient documentation

## 2018-05-14 DIAGNOSIS — R531 Weakness: Secondary | ICD-10-CM | POA: Diagnosis not present

## 2018-05-14 DIAGNOSIS — Z79899 Other long term (current) drug therapy: Secondary | ICD-10-CM | POA: Insufficient documentation

## 2018-05-14 DIAGNOSIS — R410 Disorientation, unspecified: Secondary | ICD-10-CM | POA: Diagnosis not present

## 2018-05-14 DIAGNOSIS — R404 Transient alteration of awareness: Secondary | ICD-10-CM | POA: Diagnosis not present

## 2018-05-14 LAB — URINALYSIS, ROUTINE W REFLEX MICROSCOPIC
Bilirubin Urine: NEGATIVE
Glucose, UA: NEGATIVE mg/dL
HGB URINE DIPSTICK: NEGATIVE
Ketones, ur: NEGATIVE mg/dL
Leukocytes, UA: NEGATIVE
NITRITE: NEGATIVE
PROTEIN: NEGATIVE mg/dL
SPECIFIC GRAVITY, URINE: 1.011 (ref 1.005–1.030)
pH: 6 (ref 5.0–8.0)

## 2018-05-14 LAB — CBG MONITORING, ED: GLUCOSE-CAPILLARY: 101 mg/dL — AB (ref 70–99)

## 2018-05-14 LAB — CBC
HCT: 35.8 % — ABNORMAL LOW (ref 39.0–52.0)
HEMOGLOBIN: 11.4 g/dL — AB (ref 13.0–17.0)
MCH: 32.8 pg (ref 26.0–34.0)
MCHC: 31.8 g/dL (ref 30.0–36.0)
MCV: 102.9 fL — ABNORMAL HIGH (ref 80.0–100.0)
PLATELETS: 156 10*3/uL (ref 150–400)
RBC: 3.48 MIL/uL — ABNORMAL LOW (ref 4.22–5.81)
RDW: 12.6 % (ref 11.5–15.5)
WBC: 6.4 10*3/uL (ref 4.0–10.5)
nRBC: 0 % (ref 0.0–0.2)

## 2018-05-14 LAB — BASIC METABOLIC PANEL
ANION GAP: 6 (ref 5–15)
BUN: 33 mg/dL — AB (ref 8–23)
CALCIUM: 8.7 mg/dL — AB (ref 8.9–10.3)
CO2: 23 mmol/L (ref 22–32)
Chloride: 111 mmol/L (ref 98–111)
Creatinine, Ser: 1.43 mg/dL — ABNORMAL HIGH (ref 0.61–1.24)
GFR calc Af Amer: 50 mL/min — ABNORMAL LOW (ref >60)
GFR, EST NON AFRICAN AMERICAN: 43 mL/min — AB (ref >60)
Glucose, Bld: 110 mg/dL — ABNORMAL HIGH (ref 70–99)
Potassium: 3.9 mmol/L (ref 3.5–5.1)
SODIUM: 140 mmol/L (ref 135–145)

## 2018-05-14 LAB — TROPONIN I: Troponin I: <0.03 ng/mL (ref <0.03)

## 2018-05-14 MED ORDER — SODIUM CHLORIDE 0.9 % IV BOLUS
1000.0000 mL | Freq: Once | INTRAVENOUS | Status: AC
  Administered 2018-05-14: 1000 mL via INTRAVENOUS

## 2018-05-14 NOTE — Telephone Encounter (Signed)
Called the pt's son and advised the son that Ward Givens had an opening on thur. Pt will come in for the apt. Pt will check in 8:30 am for a 9 am apt.

## 2018-05-14 NOTE — ED Notes (Signed)
Social work at bedside.  

## 2018-05-14 NOTE — ED Triage Notes (Signed)
Per ems- pt comes from home with parkinsons. Has been weaker over the past few days, multiple falls over last month. Has staples to head from prior fall. Pt denies any pain. Was up this morning, and got weak, assisted to floor by daughter. BP 132/60, HR 84, CBG 108

## 2018-05-14 NOTE — ED Provider Notes (Signed)
[place] EMERGENCY DEPARTMENT Provider Note   CSN: [id number] Arrival date & time: 05/14/18  0845     History   Chief Complaint Chief Complaint  Patient presents with  . Weakness    HPI Austin Juarez is a 84 y.o. male.  He was evaluated 2 days ago for frequent falls and dehydration.  He is a history of Parkinson's and has had multiple falls over the last few months.  He recently needed staples for another laceration.  He was given some IV fluids and had some basic lab work done 2 days ago.  Family states he has been eating fairly well but not drinking very much.  The patient himself denies any complaints.  Today he was having difficulty with any transfer and so they brought him up here.  No headache no chest pain no shortness of breath no focal weakness or numbness.  No vomiting or diarrhea.  The history is provided by the patient and a relative.  Weakness  Primary symptoms include movement disorder.  Primary symptoms include no focal weakness, no visual change. This is a recurrent problem. The current episode started 1 to 2 hours ago. The problem has not changed since onset.There was no focality noted. There has been no fever. Pertinent negatives include no shortness of breath, no chest pain, no vomiting, no altered mental status, no confusion and no headaches. There were no medications administered prior to arrival. Associated medical issues include trauma. Associated medical issues do not include CVA. Associated medical issues comments: parkinsons.    Past Medical History:  Diagnosis Date  . Hypotension   . Kidney stones   . Memory loss   . PD (Parkinson's disease) [facility])     Patient Active Problem List   Diagnosis Date Noted  . Syncope 12/13/2017  . PD (Parkinson's disease) ([place]) 09/26/2017  . 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 Dementia due to Parkinson's disease without behavioral disturbance ([place]) 09/26/2017  . Closed  fracture of maxillary sinus ([place]) 06/07/2017  . Parkinson's disease (tremor, stiffness, slow motion, unstable posture) ([place]) 03/14/2013    Past Surgical History:  Procedure Laterality Date  . ARM SURGERY Left    CANCER        Home Medications    Prior to Admission medications   Medication Sig Start Date End Date Taking? Authorizing Provider  carbidopa-levodopa (SINEMET IR) 25-100 MG tablet Take 1 tablet by mouth 3 (three) times daily. 09/26/17   Dohmeier, Asencion Partridge, MD  diphenhydrAMINE (BENADRYL) 12.5 MG chewable tablet Chew 12.5 mg by mouth daily as needed for allergies.     [provider]    Family History Family History  Problem Relation Age of Onset  . Stroke Sister   . Alcoholism Daughter   . Heart attack Neg Hx     Social History Social History   Tobacco Use  . Smoking status: Never Smoker  . Smokeless tobacco: Never Used  Substance Use Topics  . Alcohol use: No  . Drug use: No     Allergies   Flomax [tamsulosin hcl] and Morphine and related   Review of Systems Review of Systems  Constitutional: Negative for fever.  HENT: Negative for sore throat.   Eyes: Negative for visual disturbance.  Respiratory: Negative for shortness of breath.   Cardiovascular: Negative for chest pain.  Gastrointestinal: Negative for abdominal pain and vomiting.  Genitourinary: Negative for dysuria.  Musculoskeletal: Negative for neck pain.  Skin: Negative for rash.  Neurological: Positive  for weakness. Negative for focal weakness and headaches.  Psychiatric/Behavioral: Negative for confusion.     Physical Exam Updated Vital Signs BP (!) 150/65 (BP Location: Right Arm)   Pulse (!) 53   Temp 98.1 F (36.7 C) (Oral)   Resp 17   SpO2 99%   Physical Exam  Constitutional: He appears well-developed and well-nourished.  HENT:  Head: Normocephalic.  Eyes: Conjunctivae are normal.  Neck: Neck supple.  Cardiovascular: Normal rate and regular rhythm.  No murmur  heard. Pulmonary/Chest: Effort normal and breath sounds normal. No respiratory distress.  Abdominal: Soft. There is no tenderness.  Musculoskeletal: He exhibits edema (bilat LE). He exhibits no tenderness or deformity.  Neurological: He is alert. No sensory deficit. GCS eye subscore is 4. GCS verbal subscore is 5. GCS motor subscore is 6.  Able to move all ext nonfocal.   Skin: Skin is warm and dry.  Psychiatric: He has a normal mood and affect.  Nursing note and vitals reviewed.    ED Treatments / Results  Labs (all labs ordered are listed, but only abnormal results are displayed) Labs Reviewed  BASIC METABOLIC PANEL - Abnormal; Notable for the following components:      Result Value   Glucose, Bld 110 (*)    BUN 33 (*)    Creatinine, Ser 1.43 (*)    Calcium 8.7 (*)    GFR calc non Af Amer 43 (*)    GFR calc Af Amer 50 (*)    All other components within normal limits  CBC - Abnormal; Notable for the following components:   RBC 3.48 (*)    Hemoglobin 11.4 (*)    HCT 35.8 (*)    MCV 102.9 (*)    All other components within normal limits  URINALYSIS, ROUTINE W REFLEX MICROSCOPIC - Abnormal; Notable for the following components:   Color, Urine STRAW (*)    All other components within normal limits  CBG MONITORING, ED - Abnormal; Notable for the following components:   Glucose-Capillary 101 (*)    All other components within normal limits  TROPONIN I    EKG EKG Interpretation  Date/Time:  Monday May 14 2018 09:05:12 EST Ventricular Rate:  54 PR Interval:    QRS Duration: 86 QT Interval:  430 QTC Calculation: 408 R Axis:   63 Text Interpretation:  sinus rhythm , prob 1st avb Probable anterior infarct, old similar to prior 6/19 Confirmed by Aletta Edouard [id number]) on 05/14/2018 9:11:08 AM   Radiology Ct Head Wo Contrast  Result Date: 05/14/2018 CLINICAL DATA:  84 year old male with increasing weakness and ataxia with recent falls. Patient with Parkinson's.  EXAM: CT HEAD WITHOUT CONTRAST TECHNIQUE: Contiguous axial images were obtained from the base of the skull through the vertex without intravenous contrast. COMPARISON:  06/06/2017 and prior CTs FINDINGS: Brain: No evidence of acute infarction, hemorrhage, hydrocephalus, extra-axial collection or mass lesion/mass effect. Mild atrophy and chronic small-vessel white matter ischemic changes again noted. Vascular: Atherosclerotic calcifications again noted. Skull: No acute abnormality Sinuses/Orbits: No acute finding. Other: Surgical staples along the posterior LEFT scalp noted. IMPRESSION: 1. No evidence of acute intracranial abnormality 2. Atrophy and chronic small-vessel white matter ischemic changes. 3. LEFT scalp surgical staples Electronically Signed   By: Margarette Canada M.D.   On: 05/14/2018 11:03    Procedures Procedures (including critical care time)  Medications Ordered in ED Medications  sodium chloride 0.9 % bolus 1,000 mL (0 mLs Intravenous Stopped 05/14/18 1537)     Initial  Impression / Assessment and Plan / ED Course  I have reviewed the triage vital signs and the nursing notes.  Pertinent labs & imaging results that were available during my care of the patient were reviewed by me and considered in my medical decision making (see chart for details).  Clinical Course as of May 14 1836  Mon May 14, 2018  1300 Patient received IV fluids.  Labs are essentially baseline.  He attempted to ambulate with a walker and was able to get from the bathroom and mostly way back to the bed before he needed to sit down.  He was unsteady.   [MB]  26 Family now states they also noticed some slurred speech with him earlier.  He is not safe to send home with his unsteady gait.  I have placed a call into hospitalist regarding admission.   [MB]  4656 Discussed with Dr. Lorin Mercy from the hospitalist service she does not see an admission criteria.  She asked if I would discuss with Dr. Cheral Marker from neurology.   Dr. Cheral Marker also agrees he does not have anything obvious from neurologic standpoint and probably has some chronic worsening of his Parkinson's.   [MB]  1517 I reviewed the recommendations from the hospitalist and the neurologist that they do not see any indications for admission.  They are comfortable taking the patient home and will follow up with her neurologist.  They understand that he may need to be moved to an area with an upper level of services.   [MB]    Clinical Course User Index [MB] Hayden Rasmussen, MD     Final Clinical Impressions(s) / ED Diagnoses   Final diagnoses:  Unsteady gait  Frequent falls  Parkinson disease [facility])    ED Discharge Orders    None       Hayden Rasmussen, MD 05/14/18 [phone]

## 2018-05-14 NOTE — Telephone Encounter (Signed)
Patient's son Richardson Landry (on [place]) states patient was seen in the ED today because he has fallen 3 times in the last 3-4 days. He has an appointment with on 12-19-18 with [facility] and would like to be seen this week.

## 2018-05-14 NOTE — ED Notes (Signed)
Discharge instructions discussed with Pt. Pt verbalized understanding. Pt stable and leaving via WC.    

## 2018-05-14 NOTE — Progress Notes (Signed)
Pt left with family prior to being seen by PT. Orders for home health PT, OT, RN, and SW to be placed by MD.   CSW will leave handoff for RNCM to establish home health care.   Wendelyn Breslow, [facility]  [phone]

## 2018-05-14 NOTE — Discharge Planning (Signed)
[facility] and EDSW consulted regarding disposition.  EDCM placed consult to Pt to evaluate for possible SNF placement.

## 2018-05-14 NOTE — ED Notes (Signed)
Pt ambulated a very short distance from room to bathroom with walker and 2 person assist, pt got very shaky all over and had to be lowered into chair to prevent fall, denies dizziness. MD Melina Copa notified.

## 2018-05-14 NOTE — Discharge Instructions (Addendum)
You were evaluated in the emergency department for increased generalized weakness and frequent falls.  You had blood work urinalysis and a CAT scan that did not show an obvious cause of your symptoms.  He will be important for you to follow-up with your neurologist and you may end up needing more services than independent living.  Please return if any concerns.

## 2018-05-16 ENCOUNTER — Telehealth: Payer: Self-pay | Admitting: *Deleted

## 2018-05-16 NOTE — Telephone Encounter (Signed)
FL2 form ready for completion tomorrow at follow up with NP.

## 2018-05-16 NOTE — Telephone Encounter (Signed)
Pt son called, need a FL 2 form fill out on the day of the appt to take to the nursing home.

## 2018-05-17 ENCOUNTER — Encounter: Payer: Self-pay | Admitting: Adult Health

## 2018-05-17 ENCOUNTER — Ambulatory Visit (INDEPENDENT_AMBULATORY_CARE_PROVIDER_SITE_OTHER): Payer: PPO | Admitting: Adult Health

## 2018-05-17 VITALS — BP 95/56 | HR 58

## 2018-05-17 DIAGNOSIS — R413 Other amnesia: Secondary | ICD-10-CM | POA: Diagnosis not present

## 2018-05-17 DIAGNOSIS — R296 Repeated falls: Secondary | ICD-10-CM

## 2018-05-17 DIAGNOSIS — G20 Parkinson's disease: Secondary | ICD-10-CM

## 2018-05-17 DIAGNOSIS — G20A1 Parkinson's disease without dyskinesia, without mention of fluctuations: Secondary | ICD-10-CM

## 2018-05-17 NOTE — Progress Notes (Signed)
I have read the note, and I agree with the clinical assessment and plan.  Charles K Willis   

## 2018-05-17 NOTE — Patient Instructions (Signed)
Your Plan:  Continue Sinemet If your symptoms worsen or you develop new symptoms please let us know.   Thank you for coming to see us at [facility]. I hope we have been able to provide you high quality care today.  You may receive a patient satisfaction survey over the next few weeks. We would appreciate your feedback and comments so that we may continue to improve ourselves and the health of our patients.  

## 2018-05-17 NOTE — Telephone Encounter (Signed)
FL2 form completed.  

## 2018-05-17 NOTE — Telephone Encounter (Signed)
Pt here with son and daughter.  Completed FL-2 for admitting into [place] from [place].  Pt had an open skin tear on L outer hand.  Cleansed with water, placed non-adhesive dressing w/abx ointment  and paper tape.  Pt tolerated well.

## 2018-05-17 NOTE — Progress Notes (Signed)
PATIENT: Austin Juarez DOB: [birth year]  REASON FOR VISIT: follow up HISTORY FROM: patient  HISTORY OF PRESENT ILLNESS: Today 05/17/18:  Austin Juarez is an 84 year old male with a history of Parkinson's disease and memory disturbance.  He returns today for follow-up.  The patient has had several falls in the last 2 weeks.  He was in the hospital several times over the weekend.  States that he fell on Friday and had to go get staples in his head.  On Saturday his mobility was limited therefore they took him back to the emergency room.  He was given some fluids and showed mild improvement in his mobility.  Monday there was increased confusion and decreased mobility and he was taken back to the emergency room.  CT of the head was unremarkable.  He was again given fluids.  Family reports that yesterday he was able to ambulate some yesterday but tired very easily.  They also note that over the weekend he did not receive his Sinemet regularly.  They are moving the patient to a skilled nursing facility at [facility].  Patient returns today for FL2 form.  HISTORY Austin Juarez is an 84 year old male with a history of Parkinson's disease and memory disturbance.  He returns today for follow-up.  He is currently taking Sinemet 1 tablet 3 times a day.  He reports that this continues to work well for him.  He currently lives at [facility].  He has an aide that with him most of the day.  He is able to complete all ADLs independently.  Reports good appetite.  Reports that he sleeps okay.  He states that his tremor has remained stable.  It primarily affects the hands right greater than left.  He states that he had a fall a couple weeks ago.   he was trying to walk backwards and he fell.  Fortunately he did not sustain any injuries.  He does use a Rollator.  His son who is with him today reports that cardiology feels that some of his falls are related to low blood pressure.  They have instructed him to increase  his salt and water intake.  He returns today for evaluation.  REVIEW OF SYSTEMS: Out of a complete 14 system review of symptoms, the patient complains only of the following symptoms, and all other reviewed systems are negative.  Memory loss, dizziness, weakness, tremors, agitation, confusion, itching, daytime sleepiness, leg swelling, eye discharge, eye itching, fatigue  ALLERGIES: Allergies  Allergen Reactions  . Flomax [Tamsulosin Hcl] Nausea Only    Violently   . Morphine And Related Other (See Comments)    nausea    HOME MEDICATIONS: Outpatient Medications Prior to Visit  Medication Sig Dispense Refill  . carbidopa-levodopa (SINEMET IR) 25-100 MG tablet Take 1 tablet by mouth 3 (three) times daily. 270 tablet 3  . diphenhydrAMINE (BENADRYL) 12.5 MG chewable tablet Chew 12.5 mg by mouth daily as needed for allergies.      No facility-administered medications prior to visit.     PAST MEDICAL HISTORY: Past Medical History:  Diagnosis Date  . Hypotension   . Kidney stones   . Memory loss   . PD (Parkinson's disease) ([place])     PAST SURGICAL HISTORY: Past Surgical History:  Procedure Laterality Date  . ARM SURGERY Left    CANCER    FAMILY HISTORY: Family History  Problem Relation Age of Onset  . Stroke Sister   . Alcoholism Daughter   . Heart  attack Neg Hx     SOCIAL HISTORY: Social History   Socioeconomic History  . Marital status: Married    Spouse name: [name]  . Number of children: 2  . Years of education: 57  . Highest education level: Not on file  Occupational History  . Occupation: retired  Scientific laboratory technician  . Financial resource strain: Not on file  . Food insecurity:    Worry: Not on file    Inability: Not on file  . Transportation needs:    Medical: Not on file    Non-medical: Not on file  Tobacco Use  . Smoking status: Never Smoker  . Smokeless tobacco: Never Used  Substance and Sexual Activity  . Alcohol use: No  . Drug use: No  . Sexual  activity: Not on file  Lifestyle  . Physical activity:    Days per week: Not on file    Minutes per session: Not on file  . Stress: Not on file  Relationships  . Social connections:    Talks on phone: Not on file    Gets together: Not on file    Attends religious service: Not on file    Active member of club or organization: Not on file    Attends meetings of clubs or organizations: Not on file    Relationship status: Not on file  . Intimate partner violence:    Fear of current or ex partner: Not on file    Emotionally abused: Not on file    Physically abused: Not on file    Forced sexual activity: Not on file  Other Topics Concern  . Not on file  Social History Narrative   Patient lives at home with wife [name].   Patient has 3 children. 1 deceased.    Patient is retired.    Patient is right handed.    Patient has a college degree.    Caffeine 2 cups coffee daily.          PHYSICAL EXAM  Vitals:   05/17/18 0858  BP: (!) 95/56  Pulse: (!) 58   There is no height or weight on file to calculate BMI.  Generalized: Well developed, in no acute distress   Neurological examination  Mentation: Alert. Follows all commands speech and language fluent.  Mask facies Cranial nerve II-XII: Pupils were equal round reactive to light. Extraocular movements were full, visual field were full on confrontational test. Facial sensation and strength were normal. Uvula tongue midline. Head turning and shoulder shrug  were normal and symmetric. Motor: The motor testing reveals 5 over 5 strength of all 4 extremities.  Mild rigidity noted in the upper extremities. Sensory: Sensory testing is intact to soft touch on all 4 extremities. No evidence of extinction is noted.   Coordination: Cerebellar testing reveals good finger-nose-finger and heel-to-shin bilaterally.  Gait and station: Patient has to push off the chair with standing.  He uses a Radiation protection practitioner.  He does have a shuffling gait.  He is to  start walking fast and then slows down.  Multiple steps with turns.   DIAGNOSTIC DATA (LABS, IMAGING, TESTING) - I reviewed patient records, labs, notes, testing and imaging myself where available.  Lab Results  Component Value Date   WBC 6.4 05/14/2018   HGB 11.4 (L) 05/14/2018   HCT 35.8 (L) 05/14/2018   MCV 102.9 (H) 05/14/2018   PLT 156 05/14/2018      Component Value Date/Time   NA 140 05/14/2018 0859   NA  143 06/28/2017 1046   K 3.9 05/14/2018 0859   CL 111 05/14/2018 0859   CO2 23 05/14/2018 0859   GLUCOSE 110 (H) 05/14/2018 0859   BUN 33 (H) 05/14/2018 0859   BUN 38 (H) 06/28/2017 1046   CREATININE 1.43 (H) 05/14/2018 0859   CALCIUM 8.7 (L) 05/14/2018 0859   PROT 7.0 05/12/2018 1642   PROT 6.1 06/28/2017 1046   ALBUMIN 4.2 05/12/2018 1642   ALBUMIN 4.0 06/28/2017 1046   AST 20 05/12/2018 1642   ALT 5 05/12/2018 1642   ALKPHOS 73 05/12/2018 1642   BILITOT 0.7 05/12/2018 1642   BILITOT 0.5 06/28/2017 1046   GFRNONAA 43 (L) 05/14/2018 0859   GFRAA 50 (L) 05/14/2018 0859       ASSESSMENT AND PLAN 84 y.o. year old male  has a past medical history of Hypotension, Kidney stones, Memory loss, and PD (Parkinson's disease) ([place]). here with:  1. Parkinson's disease 2. memory disturbance  FL2 form was completed for the patient.  He will continue on Sinemet 1 tablet 3 times a day.  I have encouraged the patient to remain hydrated.  Advised that if his symptoms worsen or he develops new symptoms he should let us know.  He will follow-up in 6 months or sooner if needed.   Ward Givens, MSN, NP-C 05/17/2018, 9:12 AM [facility] [address], [place], [place] [address] [phone]

## 2018-05-21 DIAGNOSIS — R2689 Other abnormalities of gait and mobility: Secondary | ICD-10-CM | POA: Diagnosis not present

## 2018-05-21 DIAGNOSIS — L209 Atopic dermatitis, unspecified: Secondary | ICD-10-CM | POA: Diagnosis not present

## 2018-05-21 DIAGNOSIS — M6281 Muscle weakness (generalized): Secondary | ICD-10-CM | POA: Diagnosis not present

## 2018-05-21 DIAGNOSIS — Z9181 History of falling: Secondary | ICD-10-CM | POA: Diagnosis not present

## 2018-05-21 DIAGNOSIS — J309 Allergic rhinitis, unspecified: Secondary | ICD-10-CM | POA: Diagnosis not present

## 2018-05-21 DIAGNOSIS — G20 Parkinson's disease: Secondary | ICD-10-CM | POA: Diagnosis not present

## 2018-05-22 DIAGNOSIS — R296 Repeated falls: Secondary | ICD-10-CM | POA: Diagnosis not present

## 2018-05-22 DIAGNOSIS — R2689 Other abnormalities of gait and mobility: Secondary | ICD-10-CM | POA: Diagnosis not present

## 2018-05-22 DIAGNOSIS — G20 Parkinson's disease: Secondary | ICD-10-CM | POA: Diagnosis not present

## 2018-05-22 DIAGNOSIS — L209 Atopic dermatitis, unspecified: Secondary | ICD-10-CM | POA: Diagnosis not present

## 2018-05-22 DIAGNOSIS — J309 Allergic rhinitis, unspecified: Secondary | ICD-10-CM | POA: Diagnosis not present

## 2018-05-22 DIAGNOSIS — F0280 Dementia in other diseases classified elsewhere without behavioral disturbance: Secondary | ICD-10-CM | POA: Diagnosis not present

## 2018-05-22 DIAGNOSIS — M6281 Muscle weakness (generalized): Secondary | ICD-10-CM | POA: Diagnosis not present

## 2018-05-23 ENCOUNTER — Encounter: Payer: Self-pay | Admitting: Cardiology

## 2018-05-23 ENCOUNTER — Ambulatory Visit (INDEPENDENT_AMBULATORY_CARE_PROVIDER_SITE_OTHER): Payer: PPO | Admitting: Cardiology

## 2018-05-23 VITALS — BP 130/64 | HR 58 | Ht 67.0 in | Wt 146.8 lb

## 2018-05-23 DIAGNOSIS — G3184 Mild cognitive impairment, so stated: Secondary | ICD-10-CM | POA: Diagnosis not present

## 2018-05-23 DIAGNOSIS — L209 Atopic dermatitis, unspecified: Secondary | ICD-10-CM | POA: Diagnosis not present

## 2018-05-23 DIAGNOSIS — G20 Parkinson's disease: Secondary | ICD-10-CM

## 2018-05-23 DIAGNOSIS — F0150 Vascular dementia without behavioral disturbance: Secondary | ICD-10-CM | POA: Diagnosis not present

## 2018-05-23 DIAGNOSIS — G903 Multi-system degeneration of the autonomic nervous system: Secondary | ICD-10-CM

## 2018-05-23 DIAGNOSIS — Z23 Encounter for immunization: Secondary | ICD-10-CM | POA: Diagnosis not present

## 2018-05-23 DIAGNOSIS — M6281 Muscle weakness (generalized): Secondary | ICD-10-CM | POA: Diagnosis not present

## 2018-05-23 DIAGNOSIS — R296 Repeated falls: Secondary | ICD-10-CM | POA: Diagnosis not present

## 2018-05-23 DIAGNOSIS — I951 Orthostatic hypotension: Secondary | ICD-10-CM

## 2018-05-23 DIAGNOSIS — L308 Other specified dermatitis: Secondary | ICD-10-CM | POA: Diagnosis not present

## 2018-05-23 DIAGNOSIS — J309 Allergic rhinitis, unspecified: Secondary | ICD-10-CM | POA: Diagnosis not present

## 2018-05-23 DIAGNOSIS — R2689 Other abnormalities of gait and mobility: Secondary | ICD-10-CM | POA: Diagnosis not present

## 2018-05-23 DIAGNOSIS — J3089 Other allergic rhinitis: Secondary | ICD-10-CM | POA: Diagnosis not present

## 2018-05-23 DIAGNOSIS — Z9181 History of falling: Secondary | ICD-10-CM | POA: Diagnosis not present

## 2018-05-23 DIAGNOSIS — E569 Vitamin deficiency, unspecified: Secondary | ICD-10-CM | POA: Diagnosis not present

## 2018-05-23 DIAGNOSIS — F0280 Dementia in other diseases classified elsewhere without behavioral disturbance: Secondary | ICD-10-CM | POA: Diagnosis not present

## 2018-05-23 NOTE — Patient Instructions (Signed)
Medication Instructions:  Your physician recommends that you continue on your current medications as directed. Please refer to the Current Medication list given to you today.  If you need a refill on your cardiac medications before your next appointment, please call your pharmacy.   Lab work: None.  If you have labs (blood work) drawn today and your tests are completely normal, you will receive your results only by: Marland Kitchen MyChart Message (if you have MyChart) OR . A paper copy in the mail If you have any lab test that is abnormal or we need to change your treatment, we will call you to review the results.  Testing/Procedures: None.   Follow-Up: At [facility], you and your health needs are our priority.  As part of our continuing mission to provide you with exceptional heart care, we have created designated Provider Care Teams.  These Care Teams include your primary Cardiologist (physician) and Advanced Practice Providers (APPs -  Physician Assistants and Nurse Practitioners) who all work together to provide you with the care you need, when you need it. You will need a follow up appointment in 1 months.  Please call our office 2 months in advance to schedule this appointment.  You may see No primary care provider on file. or another member of our [employer] Provider Team in [place]: Shirlee More, MD . Jyl Heinz, MD  Any Other Special Instructions Will Be Listed Below (If Applicable).  Please increase your fluid intake.

## 2018-05-23 NOTE — Progress Notes (Signed)
Cardiology Office Note:    Date:  05/23/2018   ID:  Austin Juarez, DOB 12-09-1929, MRN [id number]  PCP:  Lawerance Cruel, MD  Cardiologist:  Jenne Campus, MD    Referring MD: Lawerance Cruel, MD   Chief Complaint  Patient presents with  . Follow-up  Recently he was in the hospital twice because of weakness fatigue and falls  History of Present Illness:    Austin Juarez is a 84 y.o. male with Parkinson as well as orthostatic hypotension.  Gradually he is getting worse he required more care he ended up going to the emergency room twice because of dehydration.  Also he is complaining of being dizzy as well as frequent falls he does have orthostatic hypotension however the situation is quite complicated obviously he does have a parkinsonian which create problem with his balance on top of that lack of exercise great significant weakness therefore I suspect his balance issue and falling down is multifactorial.  On top of that he was dehydrated therefore I think the key will be to make sure he drink plenty of fluid and liberate salt intake we talked about potentially starting him on mid abdomen however his son prefer not to and I think there is still some room for improvement in terms of proper hydration.  Past Medical History:  Diagnosis Date  . Hypotension   . Kidney stones   . Memory loss   . PD (Parkinson's disease) [facility])     Past Surgical History:  Procedure Laterality Date  . ARM SURGERY Left    CANCER    Current Medications: Current Meds  Medication Sig  . carbidopa-levodopa (SINEMET IR) 25-100 MG tablet Take 1 tablet by mouth 3 (three) times daily.  Marland Kitchen loratadine (CLARITIN) 10 MG tablet Take 10 mg by mouth daily.     Allergies:   Flomax [tamsulosin hcl] and Morphine and related   Social History   Socioeconomic History  . Marital status: Married    Spouse name: [name]  . Number of children: 2  . Years of education: 47  . Highest education level: Not on  file  Occupational History  . Occupation: retired  Scientific laboratory technician  . Financial resource strain: Not on file  . Food insecurity:    Worry: Not on file    Inability: Not on file  . Transportation needs:    Medical: Not on file    Non-medical: Not on file  Tobacco Use  . Smoking status: Never Smoker  . Smokeless tobacco: Never Used  Substance and Sexual Activity  . Alcohol use: No  . Drug use: No  . Sexual activity: Not on file  Lifestyle  . Physical activity:    Days per week: Not on file    Minutes per session: Not on file  . Stress: Not on file  Relationships  . Social connections:    Talks on phone: Not on file    Gets together: Not on file    Attends religious service: Not on file    Active member of club or organization: Not on file    Attends meetings of clubs or organizations: Not on file    Relationship status: Not on file  Other Topics Concern  . Not on file  Social History Narrative   Patient lives at home with wife [name].   Patient has 3 children. 1 deceased.    Patient is retired.    Patient is right handed.    Patient has  a college degree.    Caffeine 2 cups coffee daily.         Family History: The patient's family history includes Alcoholism in his daughter; Stroke in his sister. There is no history of Heart attack. ROS:   Please see the history of present illness.    All 14 point review of systems negative except as described per history of present illness  EKGs/Labs/Other Studies Reviewed:      Recent Labs: 05/12/2018: ALT 5; B Natriuretic Peptide 88.4 05/14/2018: BUN 33; Creatinine, Ser 1.43; Hemoglobin 11.4; Platelets 156; Potassium 3.9; Sodium 140  Recent Lipid Panel No results found for: CHOL, TRIG, HDL, CHOLHDL, VLDL, LDLCALC, LDLDIRECT  Physical Exam:    VS:  BP 130/64   Pulse (!) 58   Ht 5\' 7"  (1.702 m)   Wt 146 lb 12.8 oz (66.6 kg)   SpO2 97%   BMI 22.99 kg/m     Wt Readings from Last 3 Encounters:  05/23/18 146 lb 12.8 oz  (66.6 kg)  04/04/18 154 lb 12.8 oz (70.2 kg)  02/22/18 153 lb 1.9 oz (69.5 kg)     GEN:  Well nourished, well developed in no acute distress HEENT: Normal NECK: No JVD; No carotid bruits LYMPHATICS: No lymphadenopathy CARDIAC: RRR, no murmurs, no rubs, no gallops RESPIRATORY:  Clear to auscultation without rales, wheezing or rhonchi  ABDOMEN: Soft, non-tender, non-distended MUSCULOSKELETAL:  No edema; No deformity  SKIN: Warm and dry LOWER EXTREMITIES: no swelling NEUROLOGIC:  Alert and oriented x 3 PSYCHIATRIC:  Normal affect   ASSESSMENT:    1. Dysautonomia orthostatic hypotension syndrome (HCC)   2. Parkinson's disease (tremor, stiffness, slow motion, unstable posture) (HCC)    PLAN:    In order of problems listed above:  1. Orthostatic hypotension will check with today encourage drinking plenty of fluid. 2. Parkinson's disease followed by neurology. 3. History of dehydration encouraged to drink plenty of fluids.   Medication Adjustments/Labs and Tests Ordered: Current medicines are reviewed at length with the patient today.  Concerns regarding medicines are outlined above.  No orders of the defined types were placed in this encounter.  Medication changes: No orders of the defined types were placed in this encounter.   Signed, Park Liter, MD, [facility] 05/23/2018 11:31 AM    [place]

## 2018-05-25 DIAGNOSIS — L209 Atopic dermatitis, unspecified: Secondary | ICD-10-CM | POA: Diagnosis not present

## 2018-05-25 DIAGNOSIS — Z9181 History of falling: Secondary | ICD-10-CM | POA: Diagnosis not present

## 2018-05-25 DIAGNOSIS — G20 Parkinson's disease: Secondary | ICD-10-CM | POA: Diagnosis not present

## 2018-05-25 DIAGNOSIS — J309 Allergic rhinitis, unspecified: Secondary | ICD-10-CM | POA: Diagnosis not present

## 2018-05-25 DIAGNOSIS — E559 Vitamin D deficiency, unspecified: Secondary | ICD-10-CM | POA: Diagnosis not present

## 2018-05-25 DIAGNOSIS — M6281 Muscle weakness (generalized): Secondary | ICD-10-CM | POA: Diagnosis not present

## 2018-05-25 DIAGNOSIS — R2689 Other abnormalities of gait and mobility: Secondary | ICD-10-CM | POA: Diagnosis not present

## 2018-05-26 DIAGNOSIS — R296 Repeated falls: Secondary | ICD-10-CM | POA: Diagnosis not present

## 2018-05-27 DIAGNOSIS — G20 Parkinson's disease: Secondary | ICD-10-CM | POA: Diagnosis not present

## 2018-05-27 DIAGNOSIS — F0150 Vascular dementia without behavioral disturbance: Secondary | ICD-10-CM | POA: Diagnosis not present

## 2018-05-27 DIAGNOSIS — M6281 Muscle weakness (generalized): Secondary | ICD-10-CM | POA: Diagnosis not present

## 2018-05-27 DIAGNOSIS — R278 Other lack of coordination: Secondary | ICD-10-CM | POA: Diagnosis not present

## 2018-05-27 DIAGNOSIS — R2689 Other abnormalities of gait and mobility: Secondary | ICD-10-CM | POA: Diagnosis not present

## 2018-05-28 DIAGNOSIS — L209 Atopic dermatitis, unspecified: Secondary | ICD-10-CM | POA: Diagnosis not present

## 2018-05-28 DIAGNOSIS — R21 Rash and other nonspecific skin eruption: Secondary | ICD-10-CM | POA: Diagnosis not present

## 2018-05-28 DIAGNOSIS — Z9181 History of falling: Secondary | ICD-10-CM | POA: Diagnosis not present

## 2018-05-28 DIAGNOSIS — E559 Vitamin D deficiency, unspecified: Secondary | ICD-10-CM | POA: Diagnosis not present

## 2018-05-28 DIAGNOSIS — M6281 Muscle weakness (generalized): Secondary | ICD-10-CM | POA: Diagnosis not present

## 2018-05-28 DIAGNOSIS — J309 Allergic rhinitis, unspecified: Secondary | ICD-10-CM | POA: Diagnosis not present

## 2018-05-28 DIAGNOSIS — R2689 Other abnormalities of gait and mobility: Secondary | ICD-10-CM | POA: Diagnosis not present

## 2018-05-28 DIAGNOSIS — G20 Parkinson's disease: Secondary | ICD-10-CM | POA: Diagnosis not present

## 2018-05-30 DIAGNOSIS — R296 Repeated falls: Secondary | ICD-10-CM | POA: Diagnosis not present

## 2018-05-30 DIAGNOSIS — F0280 Dementia in other diseases classified elsewhere without behavioral disturbance: Secondary | ICD-10-CM | POA: Diagnosis not present

## 2018-05-30 DIAGNOSIS — G20 Parkinson's disease: Secondary | ICD-10-CM | POA: Diagnosis not present

## 2018-06-04 DIAGNOSIS — R296 Repeated falls: Secondary | ICD-10-CM | POA: Diagnosis not present

## 2018-06-04 DIAGNOSIS — R55 Syncope and collapse: Secondary | ICD-10-CM | POA: Diagnosis not present

## 2018-06-06 DIAGNOSIS — L209 Atopic dermatitis, unspecified: Secondary | ICD-10-CM | POA: Diagnosis not present

## 2018-06-06 DIAGNOSIS — Z9181 History of falling: Secondary | ICD-10-CM | POA: Diagnosis not present

## 2018-06-06 DIAGNOSIS — E559 Vitamin D deficiency, unspecified: Secondary | ICD-10-CM | POA: Diagnosis not present

## 2018-06-06 DIAGNOSIS — G20 Parkinson's disease: Secondary | ICD-10-CM | POA: Diagnosis not present

## 2018-06-06 DIAGNOSIS — M6281 Muscle weakness (generalized): Secondary | ICD-10-CM | POA: Diagnosis not present

## 2018-06-06 DIAGNOSIS — R21 Rash and other nonspecific skin eruption: Secondary | ICD-10-CM | POA: Diagnosis not present

## 2018-06-06 DIAGNOSIS — J309 Allergic rhinitis, unspecified: Secondary | ICD-10-CM | POA: Diagnosis not present

## 2018-06-06 DIAGNOSIS — R2689 Other abnormalities of gait and mobility: Secondary | ICD-10-CM | POA: Diagnosis not present

## 2018-06-14 ENCOUNTER — Ambulatory Visit (INDEPENDENT_AMBULATORY_CARE_PROVIDER_SITE_OTHER): Payer: PPO | Admitting: Cardiology

## 2018-06-14 ENCOUNTER — Encounter: Payer: Self-pay | Admitting: Cardiology

## 2018-06-14 VITALS — BP 122/58 | HR 65 | Ht 67.0 in | Wt 141.8 lb

## 2018-06-14 DIAGNOSIS — G20 Parkinson's disease: Secondary | ICD-10-CM

## 2018-06-14 DIAGNOSIS — G903 Multi-system degeneration of the autonomic nervous system: Secondary | ICD-10-CM | POA: Diagnosis not present

## 2018-06-14 DIAGNOSIS — I951 Orthostatic hypotension: Secondary | ICD-10-CM

## 2018-06-14 NOTE — Progress Notes (Signed)
Cardiology Office Note:    Date:  06/14/2018   ID:  [name], DOB Apr 21, 1930, MRN [id number]  PCP:  Lawerance Cruel, MD  Cardiologist:  Jenne Campus, MD    Referring MD: Lawerance Cruel, MD   Chief Complaint  Patient presents with  . Abnormal ECG  . Fall  I fell down recently  History of Present Illness:    Austin Juarez is a 84 y.o. male with advanced Parkinson, also orthostatic hypotension.  He does have chronic balance problem recently he fell down he actually was up and he was trying to go backwards and then he lost his balance he fell down he ended up going to the emergency room EKG was done showed borderline first-degree AV block and he was referred back to [place] none of his symptomatology suggest that he gets significant arrhythmia his symptoms of dizziness happening when he is trying to either get up very quickly or move around rather quickly.  He moved to different nursing home and he is due to have physical therapy every day on top of that he is gone to get wheelchair and move around on the wheelchair which I think is a good idea because it simply became unsafe for him to try to walk.  Past Medical History:  Diagnosis Date  . Hypotension   . Kidney stones   . Memory loss   . PD (Parkinson's disease) [facility])     Past Surgical History:  Procedure Laterality Date  . ARM SURGERY Left    CANCER    Current Medications: Current Meds  Medication Sig  . carbidopa-levodopa (SINEMET IR) 25-100 MG tablet Take 1 tablet by mouth 3 (three) times daily.  Marland Kitchen loratadine (CLARITIN) 10 MG tablet Take 10 mg by mouth daily.     Allergies:   Flomax [tamsulosin hcl] and Morphine and related   Social History   Socioeconomic History  . Marital status: Married    Spouse name: [name]  . Number of children: 2  . Years of education: 110  . Highest education level: Not on file  Occupational History  . Occupation: retired  Scientific laboratory technician  . Financial resource strain: Not  on file  . Food insecurity:    Worry: Not on file    Inability: Not on file  . Transportation needs:    Medical: Not on file    Non-medical: Not on file  Tobacco Use  . Smoking status: Never Smoker  . Smokeless tobacco: Never Used  Substance and Sexual Activity  . Alcohol use: No  . Drug use: No  . Sexual activity: Not on file  Lifestyle  . Physical activity:    Days per week: Not on file    Minutes per session: Not on file  . Stress: Not on file  Relationships  . Social connections:    Talks on phone: Not on file    Gets together: Not on file    Attends religious service: Not on file    Active member of club or organization: Not on file    Attends meetings of clubs or organizations: Not on file    Relationship status: Not on file  Other Topics Concern  . Not on file  Social History Narrative   Patient lives at home with wife [name].   Patient has 3 children. 1 deceased.    Patient is retired.    Patient is right handed.    Patient has a college degree.    Caffeine  2 cups coffee daily.         Family History: The patient's family history includes Alcoholism in his daughter; Stroke in his sister. There is no history of Heart attack. ROS:   Please see the history of present illness.    All 14 point review of systems negative except as described per history of present illness  EKGs/Labs/Other Studies Reviewed:      Recent Labs: 05/12/2018: ALT 5; B Natriuretic Peptide 88.4 05/14/2018: BUN 33; Creatinine, Ser 1.43; Hemoglobin 11.4; Platelets 156; Potassium 3.9; Sodium 140  Recent Lipid Panel No results found for: CHOL, TRIG, HDL, CHOLHDL, VLDL, LDLCALC, LDLDIRECT  Physical Exam:    VS:  BP (!) 122/58   Pulse 65   Ht 5\' 7"  (1.702 m)   Wt 141 lb 12.8 oz (64.3 kg)   SpO2 98%   BMI 22.21 kg/m     Wt Readings from Last 3 Encounters:  06/14/18 141 lb 12.8 oz (64.3 kg)  05/23/18 146 lb 12.8 oz (66.6 kg)  04/04/18 154 lb 12.8 oz (70.2 kg)     GEN:  Well  nourished, well developed in no acute distress HEENT: Normal NECK: No JVD; No carotid bruits LYMPHATICS: No lymphadenopathy CARDIAC: RRR, no murmurs, no rubs, no gallops RESPIRATORY:  Clear to auscultation without rales, wheezing or rhonchi  ABDOMEN: Soft, non-tender, non-distended MUSCULOSKELETAL:  No edema; No deformity  SKIN: Warm and dry LOWER EXTREMITIES: no swelling NEUROLOGIC:  Alert and oriented x 3 PSYCHIATRIC:  Normal affect   ASSESSMENT:    1. Dysautonomia orthostatic hypotension syndrome (HCC)   2. PD (Parkinson's disease) ([place])   3. Parkinson's disease (tremor, stiffness, slow motion, unstable posture) ([place])    PLAN:    In order of problems listed above:  1. Orthostatic hypotension syndrome.  Frequent falls but again those are mostly related to loss of balance rather than orthostasis and changes position from sitting to upright.  I will encourage him to drink plenty of fluids I will continue present management 2. Parkinson disease followed by neurology   Medication Adjustments/Labs and Tests Ordered: Current medicines are reviewed at length with the patient today.  Concerns regarding medicines are outlined above.  No orders of the defined types were placed in this encounter.  Medication changes: No orders of the defined types were placed in this encounter.   Signed, Park Liter, MD, [facility] 06/14/2018 4:05 PM    [facility]

## 2018-06-14 NOTE — Patient Instructions (Signed)
Medication Instructions:  Your physician recommends that you continue on your current medications as directed. Please refer to the Current Medication list given to you today.  If you need a refill on your cardiac medications before your next appointment, please call your pharmacy.   Lab work: None.  If you have labs (blood work) drawn today and your tests are completely normal, you will receive your results only by: . MyChart Message (if you have MyChart) OR . A paper copy in the mail If you have any lab test that is abnormal or we need to change your treatment, we will call you to review the results.  Testing/Procedures: None.   Follow-Up: At [facility], you and your health needs are our priority.  As part of our continuing mission to provide you with exceptional heart care, we have created designated Provider Care Teams.  These Care Teams include your primary Cardiologist (physician) and Advanced Practice Providers (APPs -  Physician Assistants and Nurse Practitioners) who all work together to provide you with the care you need, when you need it. You will need a follow up appointment in 1 months.  Please call our office 2 months in advance to schedule this appointment.  You may see No primary care provider on file. or another member of our [facility] Provider Team in [place]: Brian Munley, MD . Rajan Revankar, MD  Any Other Special Instructions Will Be Listed Below (If Applicable).    

## 2018-06-18 DIAGNOSIS — R296 Repeated falls: Secondary | ICD-10-CM | POA: Diagnosis not present

## 2018-06-18 DIAGNOSIS — F0280 Dementia in other diseases classified elsewhere without behavioral disturbance: Secondary | ICD-10-CM | POA: Diagnosis not present

## 2018-06-18 DIAGNOSIS — S61411A Laceration without foreign body of right hand, initial encounter: Secondary | ICD-10-CM | POA: Diagnosis not present

## 2018-06-30 DIAGNOSIS — Z9181 History of falling: Secondary | ICD-10-CM | POA: Diagnosis not present

## 2018-06-30 DIAGNOSIS — R2681 Unsteadiness on feet: Secondary | ICD-10-CM | POA: Diagnosis not present

## 2018-07-09 DIAGNOSIS — E559 Vitamin D deficiency, unspecified: Secondary | ICD-10-CM | POA: Diagnosis not present

## 2018-07-11 ENCOUNTER — Ambulatory Visit: Payer: PPO | Admitting: Cardiology

## 2018-07-16 DIAGNOSIS — G20 Parkinson's disease: Secondary | ICD-10-CM | POA: Diagnosis not present

## 2018-07-16 DIAGNOSIS — R5383 Other fatigue: Secondary | ICD-10-CM | POA: Diagnosis not present

## 2018-07-16 DIAGNOSIS — R296 Repeated falls: Secondary | ICD-10-CM | POA: Diagnosis not present

## 2018-07-16 DIAGNOSIS — Z0000 Encounter for general adult medical examination without abnormal findings: Secondary | ICD-10-CM | POA: Diagnosis not present

## 2018-07-17 DIAGNOSIS — D649 Anemia, unspecified: Secondary | ICD-10-CM | POA: Diagnosis not present

## 2018-07-17 DIAGNOSIS — N390 Urinary tract infection, site not specified: Secondary | ICD-10-CM | POA: Diagnosis not present

## 2018-07-17 DIAGNOSIS — G20 Parkinson's disease: Secondary | ICD-10-CM | POA: Diagnosis not present

## 2018-07-20 DIAGNOSIS — G20 Parkinson's disease: Secondary | ICD-10-CM | POA: Diagnosis not present

## 2018-07-20 DIAGNOSIS — S61412A Laceration without foreign body of left hand, initial encounter: Secondary | ICD-10-CM | POA: Diagnosis not present

## 2018-07-20 DIAGNOSIS — R296 Repeated falls: Secondary | ICD-10-CM | POA: Diagnosis not present

## 2018-07-20 DIAGNOSIS — F419 Anxiety disorder, unspecified: Secondary | ICD-10-CM | POA: Diagnosis not present

## 2018-07-23 ENCOUNTER — Encounter: Payer: Self-pay | Admitting: Cardiology

## 2018-07-23 ENCOUNTER — Ambulatory Visit (INDEPENDENT_AMBULATORY_CARE_PROVIDER_SITE_OTHER): Payer: PPO | Admitting: Cardiology

## 2018-07-23 VITALS — BP 110/60 | HR 61 | Wt 136.6 lb

## 2018-07-23 DIAGNOSIS — G903 Multi-system degeneration of the autonomic nervous system: Secondary | ICD-10-CM | POA: Diagnosis not present

## 2018-07-23 DIAGNOSIS — R55 Syncope and collapse: Secondary | ICD-10-CM

## 2018-07-23 DIAGNOSIS — I951 Orthostatic hypotension: Secondary | ICD-10-CM

## 2018-07-23 NOTE — Patient Instructions (Signed)
Medication Instructions:  Your physician recommends that you continue on your current medications as directed. Please refer to the Current Medication list given to you today.  If you need a refill on your cardiac medications before your next appointment, please call your pharmacy.   Lab work: None  If you have labs (blood work) drawn today and your tests are completely normal, you will receive your results only by: Marland Kitchen MyChart Message (if you have MyChart) OR . A paper copy in the mail If you have any lab test that is abnormal or we need to change your treatment, we will call you to review the results.  Testing/Procedures: You had an EKG today.   Follow-Up: At [facility], you and your health needs are our priority.  As part of our continuing mission to provide you with exceptional heart care, we have created designated Provider Care Teams.  These Care Teams include your primary Cardiologist (physician) and Advanced Practice Providers (APPs -  Physician Assistants and Nurse Practitioners) who all work together to provide you with the care you need, when you need it. You will need a follow up appointment in 3 months.  Please call our office 2 months in advance to schedule this appointment.

## 2018-07-23 NOTE — Progress Notes (Signed)
Cardiology Office Note:    Date:  07/23/2018   ID:  Austin Juarez, DOB 22-Dec-1929, MRN [id number]  PCP:  Lawerance Cruel, MD  Cardiologist:  Jenne Campus, MD    Referring MD: Lawerance Cruel, MD   Chief Complaint  Patient presents with  . 1 month follow up  Doing well  History of Present Illness:    Austin Juarez is a 85 y.o. male with advanced Parkinson.  Having seen him for orthostatic hypotension.  Overall he seems to be deteriorating he is on the wheelchair but his son reports that every single week he follows maybe 2 or 3 times he is getting debilitated.  Denies have any chest pain tightness squeezing pressure burning chest son reports that he had difficulty with memory he cannot remember what he had for breakfast.  He lost some weight.  Past Medical History:  Diagnosis Date  . Hypotension   . Kidney stones   . Memory loss   . PD (Parkinson's disease) [facility])     Past Surgical History:  Procedure Laterality Date  . ARM SURGERY Left    CANCER    Current Medications: Current Meds  Medication Sig  . carbidopa-levodopa (SINEMET IR) 25-100 MG tablet Take 1 tablet by mouth 3 (three) times daily.  Marland Kitchen loratadine (CLARITIN) 10 MG tablet Take 10 mg by mouth daily.  Marland Kitchen LORazepam (ATIVAN) 0.5 MG tablet Take 1 tablet by mouth as needed for anxiety.  . triamcinolone cream (KENALOG) 0.1 % Apply 1 application topically 2 (two) times daily.   . Vitamin D, Cholecalciferol, 50 MCG (2000 UT) CAPS Take 2 tablets by mouth daily.     Allergies:   Flomax [tamsulosin hcl] and Morphine and related   Social History   Socioeconomic History  . Marital status: Married    Spouse name: [name]  . Number of children: 2  . Years of education: 9  . Highest education level: Not on file  Occupational History  . Occupation: retired  Scientific laboratory technician  . Financial resource strain: Not on file  . Food insecurity:    Worry: Not on file    Inability: Not on file  . Transportation needs:      Medical: Not on file    Non-medical: Not on file  Tobacco Use  . Smoking status: Never Smoker  . Smokeless tobacco: Never Used  Substance and Sexual Activity  . Alcohol use: No  . Drug use: No  . Sexual activity: Not on file  Lifestyle  . Physical activity:    Days per week: Not on file    Minutes per session: Not on file  . Stress: Not on file  Relationships  . Social connections:    Talks on phone: Not on file    Gets together: Not on file    Attends religious service: Not on file    Active member of club or organization: Not on file    Attends meetings of clubs or organizations: Not on file    Relationship status: Not on file  Other Topics Concern  . Not on file  Social History Narrative   Patient lives at home with wife [name].   Patient has 3 children. 1 deceased.    Patient is retired.    Patient is right handed.    Patient has a college degree.    Caffeine 2 cups coffee daily.         Family History: The patient's family history includes Alcoholism in his  daughter; Stroke in his sister. There is no history of Heart attack. ROS:   Please see the history of present illness.    All 14 point review of systems negative except as described per history of present illness  EKGs/Labs/Other Studies Reviewed:      Recent Labs: 05/12/2018: ALT 5; B Natriuretic Peptide 88.4 05/14/2018: BUN 33; Creatinine, Ser 1.43; Hemoglobin 11.4; Platelets 156; Potassium 3.9; Sodium 140  Recent Lipid Panel No results found for: CHOL, TRIG, HDL, CHOLHDL, VLDL, LDLCALC, LDLDIRECT  Physical Exam:    VS:  BP 110/60   Pulse 61   Wt 136 lb 9.6 oz (62 kg) Comment: Reported by facility  SpO2 96%   BMI 21.39 kg/m     Wt Readings from Last 3 Encounters:  07/23/18 136 lb 9.6 oz (62 kg)  06/14/18 141 lb 12.8 oz (64.3 kg)  05/23/18 146 lb 12.8 oz (66.6 kg)     GEN:  Well nourished, well developed in no acute distress HEENT: Normal NECK: No JVD; No carotid bruits LYMPHATICS: No  lymphadenopathy CARDIAC: RRR, no murmurs, no rubs, no gallops RESPIRATORY:  Clear to auscultation without rales, wheezing or rhonchi  ABDOMEN: Soft, non-tender, non-distended MUSCULOSKELETAL:  No edema; No deformity  SKIN: Warm and dry LOWER EXTREMITIES: no swelling NEUROLOGIC:  Alert and oriented x 3 PSYCHIATRIC:  Normal affect   ASSESSMENT:    1. Dysautonomia orthostatic hypotension syndrome (HCC)   2. Syncope, unspecified syncope type    PLAN:    In order of problems listed above:  1. Dysautonomia with orthostatic hypotension again I stressed importance of eating well and drinking well he may benefit from Ensure.  I told him that he needs to exercise on a regular basis and he needs to not get up by himself he needs some help with it. 2. Syncope.  Denies having any. 3.  Parkinsonian: Advanced and progressive  Overall I have to admit that he is deteriorating.  However this is secondary to advanced and progressive parkinsonian   Medication Adjustments/Labs and Tests Ordered: Current medicines are reviewed at length with the patient today.  Concerns regarding medicines are outlined above.  No orders of the defined types were placed in this encounter.  Medication changes: No orders of the defined types were placed in this encounter.   Signed, Park Liter, MD, [facility] 07/23/2018 2:44 PM    [place]

## 2018-07-24 DIAGNOSIS — J309 Allergic rhinitis, unspecified: Secondary | ICD-10-CM | POA: Diagnosis not present

## 2018-07-24 DIAGNOSIS — R296 Repeated falls: Secondary | ICD-10-CM | POA: Diagnosis not present

## 2018-07-24 DIAGNOSIS — G20 Parkinson's disease: Secondary | ICD-10-CM | POA: Diagnosis not present

## 2018-07-24 DIAGNOSIS — E559 Vitamin D deficiency, unspecified: Secondary | ICD-10-CM | POA: Diagnosis not present

## 2018-07-24 DIAGNOSIS — F0280 Dementia in other diseases classified elsewhere without behavioral disturbance: Secondary | ICD-10-CM | POA: Diagnosis not present

## 2018-07-24 DIAGNOSIS — L209 Atopic dermatitis, unspecified: Secondary | ICD-10-CM | POA: Diagnosis not present

## 2018-07-24 DIAGNOSIS — R21 Rash and other nonspecific skin eruption: Secondary | ICD-10-CM | POA: Diagnosis not present

## 2018-07-24 DIAGNOSIS — F419 Anxiety disorder, unspecified: Secondary | ICD-10-CM | POA: Diagnosis not present

## 2018-07-24 DIAGNOSIS — N182 Chronic kidney disease, stage 2 (mild): Secondary | ICD-10-CM | POA: Diagnosis not present

## 2018-07-24 DIAGNOSIS — R54 Age-related physical debility: Secondary | ICD-10-CM | POA: Diagnosis not present

## 2018-07-30 DIAGNOSIS — S30810A Abrasion of lower back and pelvis, initial encounter: Secondary | ICD-10-CM | POA: Diagnosis not present

## 2018-08-01 DIAGNOSIS — F0280 Dementia in other diseases classified elsewhere without behavioral disturbance: Secondary | ICD-10-CM | POA: Diagnosis not present

## 2018-08-01 DIAGNOSIS — F064 Anxiety disorder due to known physiological condition: Secondary | ICD-10-CM | POA: Diagnosis not present

## 2018-08-01 DIAGNOSIS — G20 Parkinson's disease: Secondary | ICD-10-CM | POA: Diagnosis not present

## 2018-08-03 DIAGNOSIS — R296 Repeated falls: Secondary | ICD-10-CM | POA: Diagnosis not present

## 2018-08-03 DIAGNOSIS — M6281 Muscle weakness (generalized): Secondary | ICD-10-CM | POA: Diagnosis not present

## 2018-08-03 DIAGNOSIS — S61411A Laceration without foreign body of right hand, initial encounter: Secondary | ICD-10-CM | POA: Diagnosis not present

## 2018-08-07 DIAGNOSIS — R2689 Other abnormalities of gait and mobility: Secondary | ICD-10-CM | POA: Diagnosis not present

## 2018-08-07 DIAGNOSIS — G20 Parkinson's disease: Secondary | ICD-10-CM | POA: Diagnosis not present

## 2018-08-08 DIAGNOSIS — F064 Anxiety disorder due to known physiological condition: Secondary | ICD-10-CM | POA: Diagnosis not present

## 2018-08-08 DIAGNOSIS — G20 Parkinson's disease: Secondary | ICD-10-CM | POA: Diagnosis not present

## 2018-08-08 DIAGNOSIS — F0280 Dementia in other diseases classified elsewhere without behavioral disturbance: Secondary | ICD-10-CM | POA: Diagnosis not present

## 2018-08-15 DIAGNOSIS — F0150 Vascular dementia without behavioral disturbance: Secondary | ICD-10-CM | POA: Diagnosis not present

## 2018-08-15 DIAGNOSIS — G20 Parkinson's disease: Secondary | ICD-10-CM | POA: Diagnosis not present

## 2018-08-15 DIAGNOSIS — F064 Anxiety disorder due to known physiological condition: Secondary | ICD-10-CM | POA: Diagnosis not present

## 2018-08-15 DIAGNOSIS — S85911A Laceration of unspecified blood vessel at lower leg level, right leg, initial encounter: Secondary | ICD-10-CM | POA: Diagnosis not present

## 2018-08-15 DIAGNOSIS — R569 Unspecified convulsions: Secondary | ICD-10-CM | POA: Diagnosis not present

## 2018-08-16 DIAGNOSIS — R54 Age-related physical debility: Secondary | ICD-10-CM | POA: Diagnosis not present

## 2018-08-16 DIAGNOSIS — J309 Allergic rhinitis, unspecified: Secondary | ICD-10-CM | POA: Diagnosis not present

## 2018-08-16 DIAGNOSIS — N182 Chronic kidney disease, stage 2 (mild): Secondary | ICD-10-CM | POA: Diagnosis not present

## 2018-08-16 DIAGNOSIS — F419 Anxiety disorder, unspecified: Secondary | ICD-10-CM | POA: Diagnosis not present

## 2018-08-16 DIAGNOSIS — R296 Repeated falls: Secondary | ICD-10-CM | POA: Diagnosis not present

## 2018-08-16 DIAGNOSIS — L209 Atopic dermatitis, unspecified: Secondary | ICD-10-CM | POA: Diagnosis not present

## 2018-08-16 DIAGNOSIS — G20 Parkinson's disease: Secondary | ICD-10-CM | POA: Diagnosis not present

## 2018-08-16 DIAGNOSIS — F0280 Dementia in other diseases classified elsewhere without behavioral disturbance: Secondary | ICD-10-CM | POA: Diagnosis not present

## 2018-08-16 DIAGNOSIS — R21 Rash and other nonspecific skin eruption: Secondary | ICD-10-CM | POA: Diagnosis not present

## 2018-08-16 DIAGNOSIS — E559 Vitamin D deficiency, unspecified: Secondary | ICD-10-CM | POA: Diagnosis not present

## 2018-08-22 ENCOUNTER — Telehealth: Payer: Self-pay | Admitting: Neurology

## 2018-08-22 NOTE — Telephone Encounter (Signed)
Noted card will be mailed out! thanks

## 2018-08-22 NOTE — Telephone Encounter (Signed)
Pt son called to inform pt passed on 14-Sep-2022

## 2018-08-26 DEATH — deceased

## 2018-12-19 ENCOUNTER — Ambulatory Visit: Payer: PPO | Admitting: Adult Health

## 2019-08-30 IMAGING — CT CT CERVICAL SPINE W/O CM
4 of 10 series · 8 of 33 positions shown, 9 images · non-contrast
Comparison: Head CT 05/30/2012

CLINICAL DATA: Fall, bruising to left side of face.

EXAM:
CT HEAD WITHOUT CONTRAST
CT MAXILLOFACIAL WITHOUT CONTRAST
CT CERVICAL SPINE WITHOUT CONTRAST
TECHNIQUE: Multidetector CT imaging of the head, cervical spine, and
maxillofacial structures were performed using the standard protocol
without intravenous contrast. Multiplanar CT image reconstructions
of the cervical spine and maxillofacial structures were also
generated.

[Series 7: max soft · axial · 0.33mm/px · z∈[-209,-157]mm · 2 of 79 slices shown]
[im 27/79  soft-tissue]
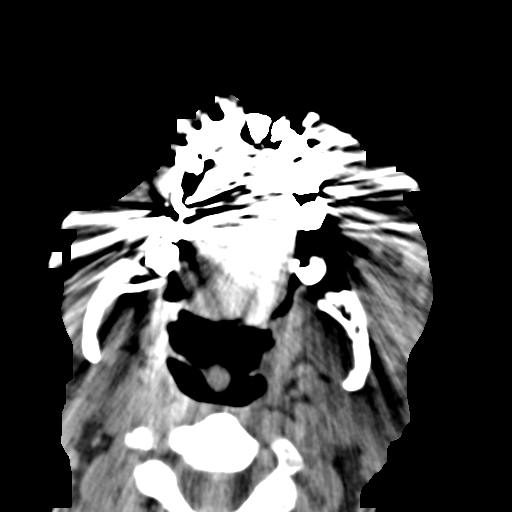
[im 53/79  soft-tissue]
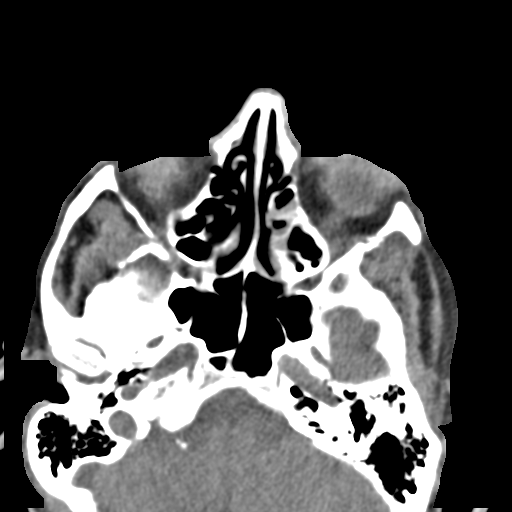

[Series 16: c spine soft · axial · 0.27mm/px · z∈[-253,-201]mm · 2 of 79 slices shown]
[im 27/79  soft-tissue]
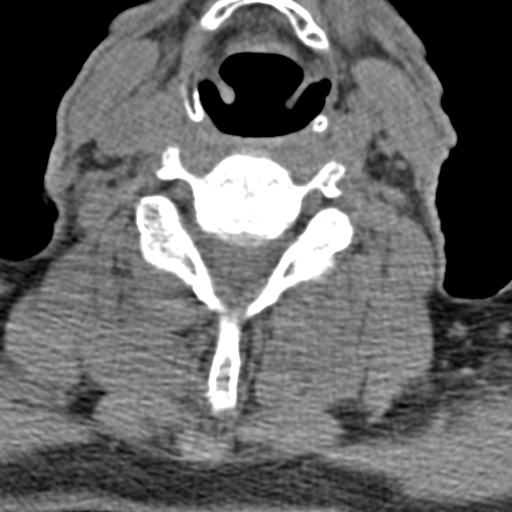
[im 53/79  soft-tissue]
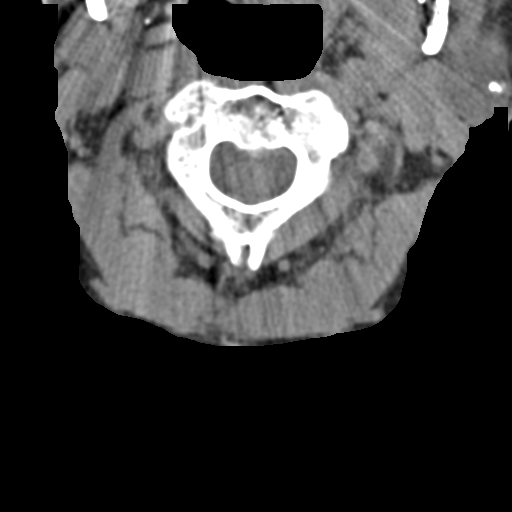

[Series 17: orthogonal axials · axial · 0.23mm/px · z∈[-300,-213]mm · 3 of 101 slices shown, 4 images]
[im 26/101  soft-tissue]
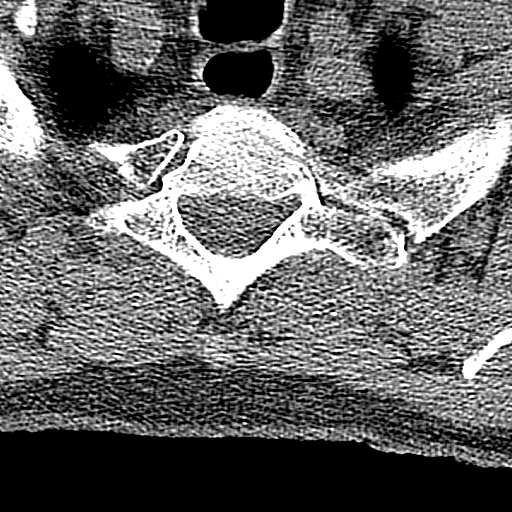
[im 26/101  bone]
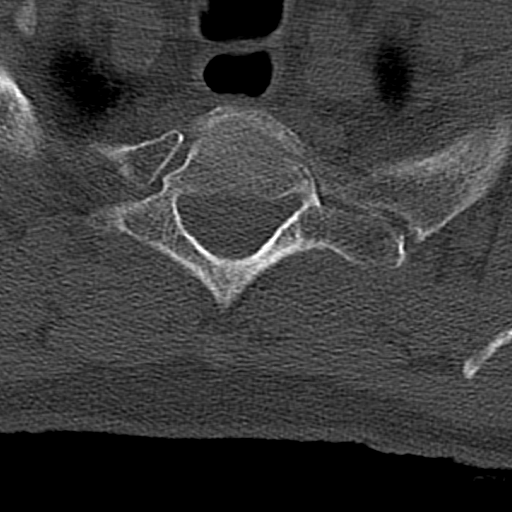
[im 51/101  bone]
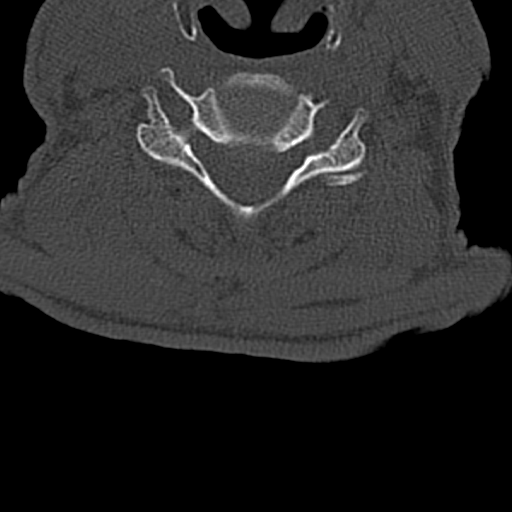
[im 76/101  bone]
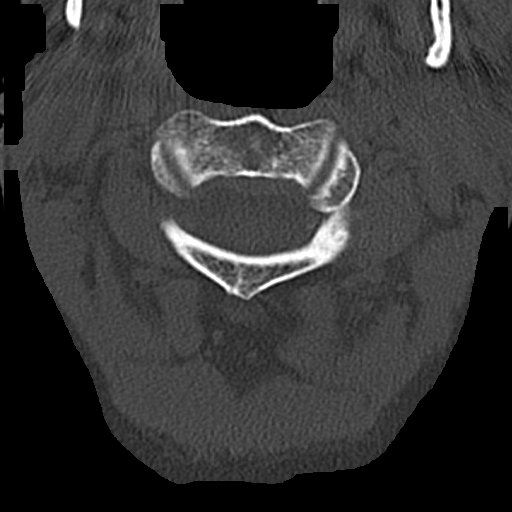

[Series 19: sagittal bone · sagittal · 0.36mm/px · 1 of 44 slices shown]
[im 22/44  bone]
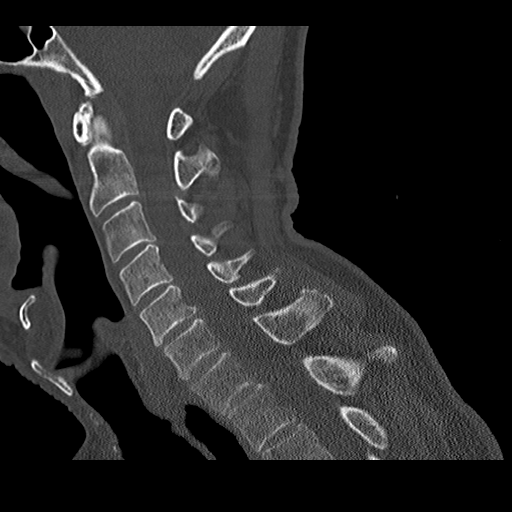

[8 of 33 positions shown; findings below may reference images not displayed]

FINDINGS: CT HEAD FINDINGS

Brain: There is atrophy and chronic small vessel disease changes. No
acute intracranial abnormality. Specifically, no hemorrhage,
hydrocephalus, mass lesion, acute infarction, or significant
intracranial injury.

Vascular: No hyperdense vessel or unexpected calcification.

Skull: Fractures through the left orbit, visualized superior left
maxillary sinus, and left zygomatic arch noted. See further
discussion under facial CT. No calvarial fracture.

Other: None

CT MAXILLOFACIAL FINDINGS

Osseous: Fracture noted through the left zygomatic arch, lateral
wall of the left orbit and floor of the left orbit. Fractures
through lateral wall of the left maxillary sinus. Fracture through
the inferior anterior left maxillary sinus wall which extends
inferiorly into the left maxilla between the left upper premolar and
molar. Fracture through the medial wall of the left maxillary sinus
and into the hard palate. Fracture through the left lateral
pterygoid plate.

Orbits: Soft tissue swelling over the left orbit. Fractures of the
left orbit as above. Globe is intact.

Sinuses: Blood throughout the left maxillary sinus. Mucosal
thickening in the ethmoid air cells and frontal sinuses.

Soft tissues: Soft tissue swelling over the left orbit and face.
There is soft tissue gas noted in the masticator space on the left.

CT CERVICAL SPINE FINDINGS

Alignment: Normal

Skull base and vertebrae: No fracture

Soft tissues and spinal canal: Prevertebral soft tissues are normal.
No epidural or paraspinal hematoma.

Disc levels:  Maintained

Upper chest: Negative

Other: Carotid artery calcifications bilaterally.
IMPRESSION: No acute intracranial abnormality. Atrophy, chronic small vessel
disease.

Extensive left orbital and facial fractures including left tripod
fracture. Fractures through all walls of the left maxillary sinus
extend into the left maxilla between the left upper premolar and
adjacent molar and into the left side of the hard palate. Fracture
through the lateral left pterygoid plate.

No acute bony abnormality in the cervical spine.
